# Patient Record
Sex: Female | Born: 1965 | Race: White | Hispanic: No | Marital: Married | Smoking: Never smoker
Health system: Southern US, Community
[De-identification: ages and names within clinical notes are randomized; demographics above are authoritative.]

## PROBLEM LIST (undated history)

## (undated) DIAGNOSIS — J45909 Unspecified asthma, uncomplicated: Secondary | ICD-10-CM

## (undated) DIAGNOSIS — F419 Anxiety disorder, unspecified: Secondary | ICD-10-CM

## (undated) DIAGNOSIS — R112 Nausea with vomiting, unspecified: Secondary | ICD-10-CM

## (undated) DIAGNOSIS — Z973 Presence of spectacles and contact lenses: Secondary | ICD-10-CM

## (undated) DIAGNOSIS — J189 Pneumonia, unspecified organism: Secondary | ICD-10-CM

## (undated) DIAGNOSIS — T7840XA Allergy, unspecified, initial encounter: Secondary | ICD-10-CM

## (undated) DIAGNOSIS — T8859XA Other complications of anesthesia, initial encounter: Secondary | ICD-10-CM

## (undated) DIAGNOSIS — M5090 Cervical disc disorder, unspecified, unspecified cervical region: Secondary | ICD-10-CM

## (undated) DIAGNOSIS — R569 Unspecified convulsions: Secondary | ICD-10-CM

## (undated) DIAGNOSIS — Z9889 Other specified postprocedural states: Secondary | ICD-10-CM

## (undated) DIAGNOSIS — E669 Obesity, unspecified: Secondary | ICD-10-CM

## (undated) DIAGNOSIS — T4145XA Adverse effect of unspecified anesthetic, initial encounter: Secondary | ICD-10-CM

## (undated) DIAGNOSIS — R609 Edema, unspecified: Secondary | ICD-10-CM

## (undated) DIAGNOSIS — F3281 Premenstrual dysphoric disorder: Secondary | ICD-10-CM

## (undated) DIAGNOSIS — M1990 Unspecified osteoarthritis, unspecified site: Secondary | ICD-10-CM

## (undated) HISTORY — PX: CHOLECYSTECTOMY: SHX55

## (undated) HISTORY — DX: Anxiety disorder, unspecified: F41.9

## (undated) HISTORY — DX: Obesity, unspecified: E66.9

## (undated) HISTORY — DX: Presence of spectacles and contact lenses: Z97.3

## (undated) HISTORY — PX: DIAGNOSTIC LAPAROSCOPY: SUR761

## (undated) HISTORY — PX: LACERATION REPAIR: SHX5168

## (undated) HISTORY — DX: Pneumonia, unspecified organism: J18.9

## (undated) HISTORY — PX: FOREARM FRACTURE SURGERY: SHX649

## (undated) HISTORY — DX: Unspecified asthma, uncomplicated: J45.909

## (undated) HISTORY — DX: Edema, unspecified: R60.9

## (undated) HISTORY — PX: FRACTURE SURGERY: SHX138

## (undated) HISTORY — DX: Cervical disc disorder, unspecified, unspecified cervical region: M50.90

## (undated) HISTORY — DX: Allergy, unspecified, initial encounter: T78.40XA

## (undated) HISTORY — PX: WISDOM TOOTH EXTRACTION: SHX21

## (undated) HISTORY — DX: Premenstrual dysphoric disorder: F32.81

## (undated) HISTORY — PX: ROTATOR CUFF REPAIR: SHX139

---

## 1999-03-03 HISTORY — PX: TUBAL LIGATION: SHX77

## 2003-03-03 DIAGNOSIS — J189 Pneumonia, unspecified organism: Secondary | ICD-10-CM

## 2003-03-03 HISTORY — DX: Pneumonia, unspecified organism: J18.9

## 2007-11-11 ENCOUNTER — Ambulatory Visit: Payer: Self-pay | Admitting: Family Medicine

## 2008-09-25 ENCOUNTER — Ambulatory Visit: Payer: Self-pay | Admitting: Family Medicine

## 2008-10-05 ENCOUNTER — Encounter: Admission: RE | Admit: 2008-10-05 | Discharge: 2008-10-05 | Payer: Self-pay | Admitting: Family Medicine

## 2008-11-20 ENCOUNTER — Other Ambulatory Visit: Admission: RE | Admit: 2008-11-20 | Discharge: 2008-11-20 | Payer: Self-pay | Admitting: Family Medicine

## 2008-11-20 ENCOUNTER — Ambulatory Visit: Payer: Self-pay | Admitting: Family Medicine

## 2008-11-20 LAB — HM PAP SMEAR: HM Pap smear: NEGATIVE

## 2009-03-02 DIAGNOSIS — R569 Unspecified convulsions: Secondary | ICD-10-CM

## 2009-03-02 HISTORY — DX: Unspecified convulsions: R56.9

## 2010-01-08 ENCOUNTER — Ambulatory Visit: Payer: Self-pay | Admitting: Family Medicine

## 2010-02-05 ENCOUNTER — Ambulatory Visit: Payer: Self-pay | Admitting: Family Medicine

## 2010-02-06 ENCOUNTER — Encounter
Admission: RE | Admit: 2010-02-06 | Discharge: 2010-02-06 | Payer: Self-pay | Source: Home / Self Care | Admitting: Family Medicine

## 2010-09-04 ENCOUNTER — Ambulatory Visit (INDEPENDENT_AMBULATORY_CARE_PROVIDER_SITE_OTHER): Payer: BC Managed Care – PPO | Admitting: Medical

## 2010-09-04 ENCOUNTER — Encounter: Payer: Self-pay | Admitting: Medical

## 2010-09-04 VITALS — BP 110/78 | HR 88 | Temp 98.1°F | Ht 70.0 in | Wt 287.0 lb

## 2010-09-04 DIAGNOSIS — F419 Anxiety disorder, unspecified: Secondary | ICD-10-CM

## 2010-09-04 DIAGNOSIS — F410 Panic disorder [episodic paroxysmal anxiety] without agoraphobia: Secondary | ICD-10-CM

## 2010-09-04 DIAGNOSIS — F411 Generalized anxiety disorder: Secondary | ICD-10-CM

## 2010-09-04 MED ORDER — SERTRALINE HCL 25 MG PO TABS: 25.0000 mg | ORAL_TABLET | Freq: Every day | ORAL | Status: DC

## 2010-09-04 MED ORDER — ALPRAZOLAM 0.5 MG PO TABS: 0.5000 mg | ORAL_TABLET | Freq: Three times a day (TID) | ORAL | Status: DC | PRN

## 2010-09-04 NOTE — Progress Notes (Signed)
Subjective:     Adriana Rice is a 45 y.o. female who presents for new evaluation and treatment of anxiety disorder and panic attacks. She has the following anxiety symptoms: irritable, panic attacks and decreased desire to do things she normally likes doing.. Onset of symptoms was approximately 1 year ago shortly after shoulder surgery, but things have been worse since January when a new top guy came into control as her place of employment.  Symptoms have been gradually worsening since that time.  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  Things have overall been stressful since he came aboard.  She has approached him about some of these issues, but things haven't changed.  She also notes that crowds make her panicky, certain things at work can set off a panic attack.  She notes that her home life is great, she loves her family, husband is supportive, and no problems at home. She is exercising regularly.  She sees a trainer once weekly, does spin class 2 x/wk, and also does treadmill and Zumba other days per week.      She denies current suicidal and homicidal ideation.  Previous treatment includes Lexapro and Xanax in remote past, and was on some medication for 10 days last year.  Neither Lexapro or the other medication helped in the past, but she was on never for greater than a month.  She notes that current measure to help include deep breathing, taking walks, avoiding stressors, and listening to music.  She is just not dealing with things as well, and thinks she should probably be on medication as well.  She has never seen a Veterinary surgeon.  She does have good support through friends and her husband.    The following portions of the patient's history were reviewed and updated as appropriate: allergies, current medications, past family history, past medical  history, past social history, past surgical history and problem list.  Past Medical History  Diagnosis Date  . PMDD (premenstrual dysphoric disorder)   . Obesity     Review of Systems   General: No recent weight change, no anorexia Neuro: No sleep changes Psychology: Denies suicidal ideation, hallucinations, alcohol use, drug use   Objective:     Filed Vitals:   09/04/10 1436  BP: 110/78  Pulse: 88  Temp: 98.1 F (36.7 C)    General appearance: alert, no distress, WD/WN, female Psychiatric: normal affect, behavior normal, pleasant, normal hygiene and grooming.       Assessment:   Encounter Diagnoses  Name Primary?  Marland Kitchen Anxiety Yes  . Panic attack     Plan:    Reviewed concept of anxiety as biochemical imbalance of neurotransmitters and rationale for treatment. Instructed patient to contact office or on-call physician promptly should condition worsen or any new symptoms appear and provided on-call telephone numbers. IF THE PATIENT HAS ANY SUICIDAL OR HOMICIDAL IDEATIONS, CALL THE OFFICE, DISCUSS WITH A SUPPORT MEMBER, OR GO TO THE ER IMMEDIATELY. Patient was agreeable with this plan.   Discussed symptoms and concerns.  We will use a trial of Zoloft QHS, and Xanax prn use.  Discussed risks/benefits of medication, addictive potential of Xanax, and this is to be used sparingly.   Consider counseling.  Gave handout on counselors/contact info.  Discussed ways to deal with anxiety and stressors.  Recheck in  2-3 wks.  Return or call sooner if worse or no improvement.   Greater than 30 min spent face-to-face discussing concerns, evaluation, treatment plan, and follow up.

## 2010-09-19 ENCOUNTER — Encounter: Payer: Self-pay | Admitting: Family Medicine

## 2010-09-22 ENCOUNTER — Ambulatory Visit (INDEPENDENT_AMBULATORY_CARE_PROVIDER_SITE_OTHER): Payer: BC Managed Care – PPO | Admitting: Medical

## 2010-09-22 ENCOUNTER — Encounter: Payer: Self-pay | Admitting: Medical

## 2010-09-22 VITALS — BP 120/78 | HR 64 | Ht 70.0 in | Wt 287.0 lb

## 2010-09-22 DIAGNOSIS — F419 Anxiety disorder, unspecified: Secondary | ICD-10-CM

## 2010-09-22 DIAGNOSIS — F411 Generalized anxiety disorder: Secondary | ICD-10-CM

## 2010-09-22 DIAGNOSIS — R51 Headache: Secondary | ICD-10-CM

## 2010-09-22 MED ORDER — SERTRALINE HCL 50 MG PO TABS: 50.0000 mg | ORAL_TABLET | Freq: Every day | ORAL | Status: DC

## 2010-09-22 NOTE — Progress Notes (Signed)
Subjective:     Adriana Rice is a 45 y.o. female who presents for recheck.  I saw her a few weeks ago for increased stress and anxiety symptoms, mostly work related.  We started her on Zoloft 25mg  which is helping significantly.  She notes some headache, but she has also recently cut back on caffeine.  She does feel as though she is dealing with the anxiety episodes better.  Work is no better in general, but she is coping and doing the best she can.  She is not seeing counseling.  No other new c/o.    She has the following anxiety symptoms: irritable, panic attacks and decreased desire to do things she normally likes doing.. Onset of symptoms was approximately 1 year ago shortly after shoulder surgery, but things have been worse since January when a new top guy came into control as her place of employment.  Symptoms have been gradually worsening since that time.  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  Things have overall been stressful since he came aboard.  She has approached him about some of these issues, but things haven't changed.  She also notes that crowds make her panicky, certain things at work can set off a panic attack.  She notes that her home life is great, she loves her family, husband is supportive, and no problems at home. She is exercising regularly.  She sees a trainer once weekly, does spin class 2 x/wk, and also does treadmill and Zumba other days per week.      She denies current suicidal and homicidal ideation.  Previous treatment includes Lexapro and Xanax in remote past, and was on some medication for 10 days last year.  Neither Lexapro or the other medication helped in the past, but she was on never for greater than a month.  She notes that current measure to help include deep breathing, taking walks, avoiding stressors, and  listening to music.  She is just not dealing with things as well, and thinks she should probably be on medication as well.  She has never seen a Veterinary surgeon.  She does have good support through friends and her husband.    The following portions of the patient's history were reviewed and updated as appropriate: allergies, current medications, past family history, past medical history, past social history, past surgical history and problem list.  Past Medical History  Diagnosis Date  . Obesity   . PMDD (premenstrual dysphoric disorder)     Review of Systems   General: No recent weight change, no anorexia Allergy: no allergy problems, congestion ENT: no sore throat, ear pain, sinus pressure Heart: no cp, palpitations Lungs: no SOB, wheezing GI: no abdominal pain, nausea, vomiting Neuro: No sleep changes Psychology: Denies suicidal ideation, hallucinations, alcohol use, drug use   Objective:     Filed Vitals:   09/22/10 0808  BP: 120/78  Pulse: 64    General appearance: alert, no distress, WD/WN, female Psychiatric: normal affect, behavior normal, pleasant, normal hygiene and grooming.       Assessment:   Encounter Diagnoses  Name Primary?  Marland Kitchen Anxiety Yes  . Headache      Plan:  Anxiety - improved on Zoloft 25 mg. We will increase to 50 mg each bedtime. Discussed other strategies to reduce stress and deal with  anxiety. If the headaches continue or worsen, we will need to consider alternate medication. Of note, she has only had to use Xanax 2 times since the last visit.  Headache-likely either a caffeine withdrawal headache or medication related. Although headaches have been mild, if they persist we will need to consider changing Zoloft.

## 2010-11-04 ENCOUNTER — Other Ambulatory Visit: Payer: Self-pay | Admitting: Medical

## 2010-11-04 ENCOUNTER — Telehealth: Payer: Self-pay | Admitting: Medical

## 2010-11-04 MED ORDER — ALPRAZOLAM 0.5 MG PO TABS: 0.5000 mg | ORAL_TABLET | Freq: Three times a day (TID) | ORAL | Status: DC | PRN

## 2010-11-04 NOTE — Telephone Encounter (Signed)
pls phone in Xanax 0.5mg , 1/2-1 tablet QHS or up to TID prn, #20, no refill.  I already ordered in chart, but needs to be called out.

## 2010-11-05 ENCOUNTER — Telehealth: Payer: Self-pay | Admitting: Medical

## 2010-11-05 NOTE — Telephone Encounter (Signed)
DONE

## 2010-12-10 ENCOUNTER — Other Ambulatory Visit: Payer: Self-pay | Admitting: Medical

## 2010-12-10 ENCOUNTER — Telehealth: Payer: Self-pay | Admitting: Medical

## 2010-12-10 MED ORDER — SERTRALINE HCL 50 MG PO TABS: 50.0000 mg | ORAL_TABLET | Freq: Every day | ORAL | Status: DC

## 2010-12-10 NOTE — Telephone Encounter (Signed)
PT'S [facility] ID #[id number]

## 2011-02-02 ENCOUNTER — Telehealth: Payer: Self-pay | Admitting: Medical

## 2011-02-02 ENCOUNTER — Other Ambulatory Visit: Payer: Self-pay | Admitting: Medical

## 2011-02-02 MED ORDER — ALPRAZOLAM 0.5 MG PO TABS: 0.5000 mg | ORAL_TABLET | Freq: Three times a day (TID) | ORAL | Status: DC | PRN

## 2011-02-02 NOTE — Telephone Encounter (Signed)
See msg/refill 

## 2011-02-03 NOTE — Telephone Encounter (Signed)
I CALLED OUT XANAX .5MG  TO [facility] PER SHANE TYSINGER. CLS

## 2011-02-03 NOTE — Progress Notes (Signed)
DONR

## 2011-05-16 ENCOUNTER — Other Ambulatory Visit: Payer: Self-pay | Admitting: Medical

## 2011-05-18 NOTE — Telephone Encounter (Signed)
RX REFILL 

## 2011-06-10 ENCOUNTER — Telehealth: Payer: Self-pay | Admitting: Medical

## 2011-06-10 ENCOUNTER — Other Ambulatory Visit: Payer: Self-pay | Admitting: Medical

## 2011-06-10 MED ORDER — SERTRALINE HCL 50 MG PO TABS: 50.0000 mg | ORAL_TABLET | Freq: Every day | ORAL | Status: DC

## 2011-06-10 MED ORDER — ALPRAZOLAM 0.5 MG PO TABS: 0.5000 mg | ORAL_TABLET | Freq: Three times a day (TID) | ORAL | Status: AC | PRN

## 2011-06-10 NOTE — Telephone Encounter (Signed)
Lmom notifiying the patient that her medications was called out and that she needs a recheck and physical appointment as well. CLS

## 2011-06-10 NOTE — Telephone Encounter (Signed)
PT CALLED AND STATED THAT SHE NEEDS REFILLS. PT NEEDS REFILL ON ZOLOFT SENT TO [facility] AND REFILL ON XANAX SENT TO [facility] [address]

## 2011-06-10 NOTE — Telephone Encounter (Signed)
i refilled both.  Pls call in the Xanax to local pharmacy as requested.  But she is due for recheck at this point.  If she is over a year since last physical, recommend a recheck/physical visit. Otherwise, recheck within a month or so.

## 2011-08-16 ENCOUNTER — Other Ambulatory Visit: Payer: Self-pay | Admitting: Medical

## 2011-08-17 NOTE — Telephone Encounter (Signed)
Is this ok?

## 2011-08-18 NOTE — Telephone Encounter (Signed)
She needs OV

## 2011-08-18 NOTE — Telephone Encounter (Signed)
Lmom for the patient letting her know that she needs to schedule a follow up appointment for a medication check. CLS

## 2011-08-18 NOTE — Telephone Encounter (Signed)
Patient was notified. CLS 

## 2012-09-19 ENCOUNTER — Telehealth: Payer: Self-pay | Admitting: Family Medicine

## 2012-09-19 NOTE — Telephone Encounter (Signed)
Message copied by Janeice Robinson on Mon Sep 19, 2012  4:30 PM ------      Message from: Jac Canavan      Created: Fri Sep 16, 2012  1:41 PM       Recommend CPX.  I got her recent orthopedic notes. ------

## 2012-09-19 NOTE — Telephone Encounter (Signed)
Patient is aware of setting up an appointment and she will call back once she looks at her schedule. CLS

## 2013-07-27 ENCOUNTER — Ambulatory Visit (INDEPENDENT_AMBULATORY_CARE_PROVIDER_SITE_OTHER): Payer: BC Managed Care – PPO | Admitting: Medical

## 2013-07-27 ENCOUNTER — Encounter: Payer: Self-pay | Admitting: Medical

## 2013-07-27 VITALS — BP 134/88 | HR 82 | Temp 98.2°F | Resp 18 | Ht 68.5 in | Wt 302.0 lb

## 2013-07-27 DIAGNOSIS — Z0000 Encounter for general adult medical examination without abnormal findings: Secondary | ICD-10-CM

## 2013-07-27 DIAGNOSIS — F341 Dysthymic disorder: Secondary | ICD-10-CM

## 2013-07-27 DIAGNOSIS — R232 Flushing: Secondary | ICD-10-CM

## 2013-07-27 DIAGNOSIS — F418 Other specified anxiety disorders: Secondary | ICD-10-CM

## 2013-07-27 DIAGNOSIS — G4700 Insomnia, unspecified: Secondary | ICD-10-CM

## 2013-07-27 DIAGNOSIS — E669 Obesity, unspecified: Secondary | ICD-10-CM

## 2013-07-27 DIAGNOSIS — J45909 Unspecified asthma, uncomplicated: Secondary | ICD-10-CM

## 2013-07-27 DIAGNOSIS — N951 Menopausal and female climacteric states: Secondary | ICD-10-CM

## 2013-07-27 LAB — HEMOGLOBIN A1C
Hgb A1c MFr Bld: 5.9 % — ABNORMAL HIGH (ref <5.7)
MEAN PLASMA GLUCOSE: 123 mg/dL — AB (ref <117)

## 2013-07-27 LAB — COMPREHENSIVE METABOLIC PANEL
ALT: 23 U/L (ref 0–35)
AST: 17 U/L (ref 0–37)
Albumin: 4 g/dL (ref 3.5–5.2)
Alkaline Phosphatase: 76 U/L (ref 39–117)
BILIRUBIN TOTAL: 0.5 mg/dL (ref 0.2–1.2)
BUN: 13 mg/dL (ref 6–23)
CO2: 23 mEq/L (ref 19–32)
CREATININE: 0.73 mg/dL (ref 0.50–1.10)
Calcium: 8.8 mg/dL (ref 8.4–10.5)
Chloride: 102 mEq/L (ref 96–112)
Glucose, Bld: 103 mg/dL — ABNORMAL HIGH (ref 70–99)
Potassium: 4.6 mEq/L (ref 3.5–5.3)
Sodium: 136 mEq/L (ref 135–145)
Total Protein: 6.7 g/dL (ref 6.0–8.3)

## 2013-07-27 LAB — CBC WITH DIFFERENTIAL/PLATELET
Basophils Absolute: 0 10*3/uL (ref 0.0–0.1)
Basophils Relative: 0 % (ref 0–1)
EOS ABS: 0.2 10*3/uL (ref 0.0–0.7)
Eosinophils Relative: 2 % (ref 0–5)
HCT: 38.8 % (ref 36.0–46.0)
Hemoglobin: 13.3 g/dL (ref 12.0–15.0)
LYMPHS ABS: 2.6 10*3/uL (ref 0.7–4.0)
LYMPHS PCT: 29 % (ref 12–46)
MCH: 29.2 pg (ref 26.0–34.0)
MCHC: 34.3 g/dL (ref 30.0–36.0)
MCV: 85.1 fL (ref 78.0–100.0)
Monocytes Absolute: 0.5 10*3/uL (ref 0.1–1.0)
Monocytes Relative: 6 % (ref 3–12)
NEUTROS PCT: 63 % (ref 43–77)
Neutro Abs: 5.7 10*3/uL (ref 1.7–7.7)
PLATELETS: 343 10*3/uL (ref 150–400)
RBC: 4.56 MIL/uL (ref 3.87–5.11)
RDW: 13.2 % (ref 11.5–15.5)
WBC: 9.1 10*3/uL (ref 4.0–10.5)

## 2013-07-27 LAB — LIPID PANEL
CHOL/HDL RATIO: 3.2 ratio
Cholesterol: 165 mg/dL (ref 0–200)
HDL: 52 mg/dL (ref >39)
LDL Cholesterol: 89 mg/dL (ref 0–99)
Triglycerides: 120 mg/dL (ref <150)
VLDL: 24 mg/dL (ref 0–40)

## 2013-07-27 MED ORDER — VORTIOXETINE HBR 20 MG PO TABS: 20.0000 mg | ORAL_TABLET | Freq: Every day | ORAL | Status: DC

## 2013-07-27 MED ORDER — ALBUTEROL SULFATE HFA 108 (90 BASE) MCG/ACT IN AERS: 2.0000 | INHALATION_SPRAY | Freq: Four times a day (QID) | RESPIRATORY_TRACT | Status: DC | PRN

## 2013-07-27 MED ORDER — ALPRAZOLAM 0.5 MG PO TABS: 0.5000 mg | ORAL_TABLET | Freq: Every evening | ORAL | Status: DC | PRN

## 2013-07-27 NOTE — Patient Instructions (Signed)
Thank you for giving me the opportunity to serve you today.    Your diagnosis today includes: Encounter Diagnoses  Name Primary?  . Routine general medical examination at a health care facility Yes  . Hot flashes   . Insomnia   . Obesity, unspecified   . Depression with anxiety   . Unspecified asthma(493.90)   . Need for prophylactic vaccination against Streptococcus pneumoniae (pneumococcus)      Specific recommendations today include:  Begin Brintellix 10mg  daily.  After the samples, we will increase to 20mg  daily  Use Xanax as needed for acute panic episode  Consider counsleing with private counselor  Return pending labs, 3-4 wk.    I have included other useful information below for your review.   Counseling services   [facility] [phone] office [email or web address] [address], [address], [place], [place] [address]  Gale Journey, therapist  Franchot Erichsen, MA, clinical psychologist  Cognitive-Behavior Therapy; Mood Disorders; Anxiety Disorders; adult and child ADHD; Family Therapy; Stress Management; personal growth, and Marital Therapy.    Carlus Pavlov Ph.D., clinical psychologist Cognitive-Behavior Therapy; Mood Disorders; Anxiety Disorders; Stress     Management   [facility] [address], [place], [place] [address] [phone]   [facility] Sheran Luz, psychotherapist [address] [place], [place] [address] [phone]   Miguel Aschoff Ph.D., clinical psychologist [phone] office [address] [place], [place] [address] Cognitive Behavior Therapy, Depression, Bipolar, Anxiety, Grief and Loss   Insomnia Insomnia is frequent trouble falling and/or staying asleep. Insomnia can be a long term problem or a short term problem. Both are common. Insomnia can be a short term problem when the wakefulness is related to a certain stress or worry. Long term  insomnia is often related to ongoing stress during waking hours and/or poor sleeping habits. Overtime, sleep deprivation itself can make the problem worse. Every little thing feels more severe because you are overtired and your ability to cope is decreased. CAUSES   Stress, anxiety, and depression.  Poor sleeping habits.  Distractions such as TV in the bedroom.  Naps close to bedtime.  Engaging in emotionally charged conversations before bed.  Technical reading before sleep.  Alcohol and other sedatives. They may make the problem worse. They can hurt normal sleep patterns and normal dream activity.  Stimulants such as caffeine for several hours prior to bedtime.  Pain syndromes and shortness of breath can cause insomnia.  Exercise late at night.  Changing time zones may cause sleeping problems (jet lag). It is sometimes helpful to have someone observe your sleeping patterns. They should look for periods of not breathing during the night (sleep apnea). They should also look to see how long those periods last. If you live alone or observers are uncertain, you can also be observed at a sleep clinic where your sleep patterns will be professionally monitored. Sleep apnea requires a checkup and treatment. Give your caregivers your medical history. Give your caregivers observations your family has made about your sleep.  SYMPTOMS   Not feeling rested in the morning.  Anxiety and restlessness at bedtime.  Difficulty falling and staying asleep. TREATMENT   Your caregiver may prescribe treatment for an underlying medical disorders. Your caregiver can give advice or help if you are using alcohol or other drugs for self-medication. Treatment of underlying problems will usually eliminate insomnia problems.  Medications can be prescribed for short time use. They are generally not recommended for lengthy use.  Over-the-counter  sleep medicines are not recommended for lengthy use. They can be  habit forming.  You can promote easier sleeping by making lifestyle changes such as:  Using relaxation techniques that help with breathing and reduce muscle tension.  Exercising earlier in the day.  Changing your diet and the time of your last meal. No night time snacks.  Establish a regular time to go to bed.  Counseling can help with stressful problems and worry.  Soothing music and white noise may be helpful if there are background noises you cannot remove.  Stop tedious detailed work at least one hour before bedtime. HOME CARE INSTRUCTIONS   Keep a diary. Inform your caregiver about your progress. This includes any medication side effects. See your caregiver regularly. Take note of:  Times when you are asleep.  Times when you are awake during the night.  The quality of your sleep.  How you feel the next day. This information will help your caregiver care for you.  Get out of bed if you are still awake after 15 minutes. Read or do some quiet activity. Keep the lights down. Wait until you feel sleepy and go back to bed.  Keep regular sleeping and waking hours. Avoid naps.  Exercise regularly.  Avoid distractions at bedtime. Distractions include watching television or engaging in any intense or detailed activity like attempting to balance the household checkbook.  Develop a bedtime ritual. Keep a familiar routine of bathing, brushing your teeth, climbing into bed at the same time each night, listening to soothing music. Routines increase the success of falling to sleep faster.  Use relaxation techniques. This can be using breathing and muscle tension release routines. It can also include visualizing peaceful scenes. You can also help control troubling or intruding thoughts by keeping your mind occupied with boring or repetitive thoughts like the old concept of counting sheep. You can make it more creative like imagining planting one beautiful flower after another in your  backyard garden.  During your day, work to eliminate stress. When this is not possible use some of the previous suggestions to help reduce the anxiety that accompanies stressful situations. MAKE SURE YOU:   Understand these instructions.  Will watch your condition.  Will get help right away if you are not doing well or get worse. Document Released: 02/14/2000 Document Revised: 05/11/2011 Document Reviewed: 03/16/2007 [facility] Patient Information 2014 [employer] [place].   Menopause Menopause is the normal time of life when menstrual periods stop completely. Menopause is complete when you have missed 12 consecutive menstrual periods. It usually occurs between the ages of 48 years and 55 years. Very rarely does a woman develop menopause before the age of 40 years. At menopause, your ovaries stop producing the female hormones estrogen and progesterone. This can cause undesirable symptoms and also affect your health. Sometimes the symptoms may occur 4 5 years before the menopause begins. There is no relationship between menopause and:  Oral contraceptives.  Number of children you had.  Race.  The age your menstrual periods started (menarche). Heavy smokers and very thin women may develop menopause earlier in life. CAUSES  The ovaries stop producing the female hormones estrogen and progesterone.  Other causes include:  Surgery to remove both ovaries.  The ovaries stop functioning for no known reason.  Tumors of the pituitary gland in the brain.  Medical disease that affects the ovaries and hormone production.  Radiation treatment to the abdomen or pelvis.  Chemotherapy that affects the ovaries. SYMPTOMS  Hot flashes.  Night sweats.  Decrease in sex drive.  Vaginal dryness and thinning of the vagina causing painful intercourse.  Dryness of the skin and developing wrinkles.  Headaches.  Tiredness.  Irritability.  Memory problems.  Weight gain.  Bladder  infections.  Hair growth of the face and chest.  Infertility. More serious symptoms include:  Loss of bone (osteoporosis) causing breaks (fractures).  Depression.  Hardening and narrowing of the arteries (atherosclerosis) causing heart attacks and strokes. DIAGNOSIS   When the menstrual periods have stopped for 12 straight months.  Physical exam.  Hormone studies of the blood. TREATMENT  There are many treatment choices and nearly as many questions about them. The decisions to treat or not to treat menopausal changes is an individual choice made with your health care provider. Your health care provider can discuss the treatments with you. Together, you can decide which treatment will work best for you. Your treatment choices may include:   Hormone therapy (estrogen and progesterone).  Non-hormonal medicines.  Treating the individual symptoms with medicine (for example antidepressants for depression).  Herbal medicines that may help specific symptoms.  Counseling by a psychiatrist or psychologist.  Group therapy.  Lifestyle changes including:  Eating healthy.  Regular exercise.  Limiting caffeine and alcohol.  Stress management and meditation.  No treatment. HOME CARE INSTRUCTIONS   Take the medicine your health care provider gives you as directed.  Get plenty of sleep and rest.  Exercise regularly.  Eat a diet that contains calcium (good for the bones) and soy products (acts like estrogen hormone).  Avoid alcoholic beverages.  Do not smoke.  If you have hot flashes, dress in layers.  Take supplements, calcium, and vitamin D to strengthen bones.  You can use over-the-counter lubricants or moisturizers for vaginal dryness.  Group therapy is sometimes very helpful.  Acupuncture may be helpful in some cases. SEEK MEDICAL CARE IF:   You are not sure you are in menopause.  You are having menopausal symptoms and need advice and treatment.  You are  still having menstrual periods after age 37 years.  You have pain with intercourse.  Menopause is complete (no menstrual period for 12 months) and you develop vaginal bleeding.  You need a referral to a specialist (gynecologist, psychiatrist, or psychologist) for treatment. SEEK IMMEDIATE MEDICAL CARE IF:   You have severe depression.  You have excessive vaginal bleeding.  You fell and think you have a broken bone.  You have pain when you urinate.  You develop leg or chest pain.  You have a fast pounding heart beat (palpitations).  You have severe headaches.  You develop vision problems.  You feel a lump in your breast.  You have abdominal pain or severe indigestion. Document Released: 05/09/2003 Document Revised: 10/19/2012 Document Reviewed: 09/15/2012 [facility] Patient Information 2014 [place], [place].
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
## 2013-07-28 LAB — VITAMIN D 25 HYDROXY (VIT D DEFICIENCY, FRACTURES): VIT D 25 HYDROXY: 41 ng/mL (ref 30–89)

## 2013-07-28 LAB — TSH: TSH: 3.138 u[IU]/mL (ref 0.350–4.500)

## 2013-08-25 ENCOUNTER — Encounter: Payer: Self-pay | Admitting: Medical

## 2013-08-25 ENCOUNTER — Ambulatory Visit (INDEPENDENT_AMBULATORY_CARE_PROVIDER_SITE_OTHER): Payer: BC Managed Care – PPO | Admitting: Medical

## 2013-08-25 VITALS — BP 152/80 | HR 72 | Temp 98.0°F | Resp 16 | Wt 303.0 lb

## 2013-08-25 DIAGNOSIS — M792 Neuralgia and neuritis, unspecified: Secondary | ICD-10-CM

## 2013-08-25 DIAGNOSIS — M542 Cervicalgia: Secondary | ICD-10-CM

## 2013-08-25 DIAGNOSIS — F341 Dysthymic disorder: Secondary | ICD-10-CM

## 2013-08-25 DIAGNOSIS — IMO0002 Reserved for concepts with insufficient information to code with codable children: Secondary | ICD-10-CM

## 2013-08-25 DIAGNOSIS — F418 Other specified anxiety disorders: Secondary | ICD-10-CM

## 2013-08-25 MED ORDER — PREGABALIN 75 MG PO CAPS: 75.0000 mg | ORAL_CAPSULE | Freq: Two times a day (BID) | ORAL | Status: DC

## 2013-08-25 MED ORDER — VORTIOXETINE HBR 20 MG PO TABS: 20.0000 mg | ORAL_TABLET | Freq: Every day | ORAL | Status: DC

## 2013-08-25 MED ORDER — ALPRAZOLAM 0.5 MG PO TABS: 0.5000 mg | ORAL_TABLET | Freq: Every evening | ORAL | Status: DC | PRN

## 2013-08-25 MED ORDER — ALPRAZOLAM 0.5 MG PO TABS: ORAL_TABLET | ORAL | Status: DC
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
## 2013-08-29 ENCOUNTER — Telehealth: Payer: Self-pay | Admitting: Family Medicine

## 2013-08-29 ENCOUNTER — Other Ambulatory Visit: Payer: Self-pay | Admitting: Medical

## 2013-08-29 MED ORDER — PREGABALIN 75 MG PO CAPS: 75.0000 mg | ORAL_CAPSULE | Freq: Two times a day (BID) | ORAL | Status: DC

## 2013-08-29 NOTE — Telephone Encounter (Signed)
PT called and states Lyrica is working well, please call in rx to [facility] [phone]

## 2013-08-29 NOTE — Telephone Encounter (Signed)
I called out her 90 day supply to [facility] per Crosby Oysteravid Tysinger PAC. CLS

## 2013-08-29 NOTE — Telephone Encounter (Signed)
pls fax this for 90 day supply

## 2013-10-02 ENCOUNTER — Other Ambulatory Visit: Payer: Self-pay | Admitting: Medical

## 2013-12-29 ENCOUNTER — Other Ambulatory Visit: Payer: Self-pay | Admitting: Medical

## 2013-12-29 ENCOUNTER — Telehealth: Payer: Self-pay | Admitting: Medical

## 2013-12-29 MED ORDER — PREGABALIN 75 MG PO CAPS: 75.0000 mg | ORAL_CAPSULE | Freq: Two times a day (BID) | ORAL | Status: DC

## 2013-12-29 MED ORDER — VORTIOXETINE HBR 20 MG PO TABS: 20.0000 mg | ORAL_TABLET | Freq: Every day | ORAL | Status: DC

## 2013-12-29 NOTE — Telephone Encounter (Signed)
[facility]

## 2013-12-29 NOTE — Telephone Encounter (Signed)
Pt needs Brintellix 20mg  & Lyrica 75mg  (said to tell you that it works very well) [facility]

## 2013-12-31 ENCOUNTER — Other Ambulatory Visit: Payer: Self-pay | Admitting: Medical

## 2014-05-03 ENCOUNTER — Other Ambulatory Visit: Payer: Self-pay | Admitting: Medical

## 2014-07-13 ENCOUNTER — Other Ambulatory Visit: Payer: Self-pay | Admitting: Medical

## 2014-07-26 ENCOUNTER — Encounter: Payer: Self-pay | Admitting: Medical

## 2014-07-26 ENCOUNTER — Ambulatory Visit (INDEPENDENT_AMBULATORY_CARE_PROVIDER_SITE_OTHER): Payer: BLUE CROSS/BLUE SHIELD | Admitting: Medical

## 2014-07-26 VITALS — BP 150/90 | HR 78 | Temp 97.9°F | Resp 16 | Wt 258.0 lb

## 2014-07-26 DIAGNOSIS — E669 Obesity, unspecified: Secondary | ICD-10-CM | POA: Diagnosis not present

## 2014-07-26 DIAGNOSIS — N943 Premenstrual tension syndrome: Secondary | ICD-10-CM | POA: Diagnosis not present

## 2014-07-26 DIAGNOSIS — J4520 Mild intermittent asthma, uncomplicated: Secondary | ICD-10-CM | POA: Insufficient documentation

## 2014-07-26 DIAGNOSIS — F3281 Premenstrual dysphoric disorder: Secondary | ICD-10-CM | POA: Insufficient documentation

## 2014-07-26 DIAGNOSIS — F329 Major depressive disorder, single episode, unspecified: Secondary | ICD-10-CM

## 2014-07-26 DIAGNOSIS — F32A Depression, unspecified: Secondary | ICD-10-CM

## 2014-07-26 DIAGNOSIS — Z7185 Encounter for immunization safety counseling: Secondary | ICD-10-CM

## 2014-07-26 DIAGNOSIS — R030 Elevated blood-pressure reading, without diagnosis of hypertension: Secondary | ICD-10-CM | POA: Diagnosis not present

## 2014-07-26 DIAGNOSIS — G4700 Insomnia, unspecified: Secondary | ICD-10-CM

## 2014-07-26 DIAGNOSIS — Z7189 Other specified counseling: Secondary | ICD-10-CM

## 2014-07-26 DIAGNOSIS — Z0000 Encounter for general adult medical examination without abnormal findings: Secondary | ICD-10-CM | POA: Diagnosis not present

## 2014-07-26 DIAGNOSIS — F411 Generalized anxiety disorder: Secondary | ICD-10-CM

## 2014-07-26 LAB — COMPREHENSIVE METABOLIC PANEL
ALBUMIN: 4.1 g/dL (ref 3.5–5.2)
ALT: 28 U/L (ref 0–35)
AST: 18 U/L (ref 0–37)
Alkaline Phosphatase: 77 U/L (ref 39–117)
BUN: 22 mg/dL (ref 6–23)
CHLORIDE: 105 meq/L (ref 96–112)
CO2: 25 meq/L (ref 19–32)
CREATININE: 0.77 mg/dL (ref 0.50–1.10)
Calcium: 9.4 mg/dL (ref 8.4–10.5)
Glucose, Bld: 92 mg/dL (ref 70–99)
Potassium: 4.8 mEq/L (ref 3.5–5.3)
SODIUM: 137 meq/L (ref 135–145)
Total Bilirubin: 0.5 mg/dL (ref 0.2–1.2)
Total Protein: 7.1 g/dL (ref 6.0–8.3)

## 2014-07-26 LAB — POCT URINALYSIS DIPSTICK
BILIRUBIN UA: NEGATIVE
Blood, UA: NEGATIVE
GLUCOSE UA: NEGATIVE
KETONES UA: NEGATIVE
Leukocytes, UA: NEGATIVE
Nitrite, UA: NEGATIVE
PH UA: 8
SPEC GRAV UA: 1.015
Urobilinogen, UA: NEGATIVE

## 2014-07-26 LAB — CBC
HCT: 42.3 % (ref 36.0–46.0)
Hemoglobin: 14.1 g/dL (ref 12.0–15.0)
MCH: 29.2 pg (ref 26.0–34.0)
MCHC: 33.3 g/dL (ref 30.0–36.0)
MCV: 87.6 fL (ref 78.0–100.0)
MPV: 9.9 fL (ref 8.6–12.4)
PLATELETS: 344 10*3/uL (ref 150–400)
RBC: 4.83 MIL/uL (ref 3.87–5.11)
RDW: 13.4 % (ref 11.5–15.5)
WBC: 8 10*3/uL (ref 4.0–10.5)

## 2014-07-26 LAB — LIPID PANEL
Cholesterol: 159 mg/dL (ref 0–200)
HDL: 46 mg/dL (ref >=46)
LDL Cholesterol: 97 mg/dL (ref 0–99)
TRIGLYCERIDES: 79 mg/dL (ref <150)
Total CHOL/HDL Ratio: 3.5 Ratio
VLDL: 16 mg/dL (ref 0–40)

## 2014-07-26 MED ORDER — ALBUTEROL SULFATE HFA 108 (90 BASE) MCG/ACT IN AERS: INHALATION_SPRAY | RESPIRATORY_TRACT | Status: AC

## 2014-07-26 MED ORDER — ALPRAZOLAM 0.5 MG PO TABS: ORAL_TABLET | ORAL | Status: DC

## 2014-07-26 MED ORDER — PREGABALIN 75 MG PO CAPS: 75.0000 mg | ORAL_CAPSULE | Freq: Two times a day (BID) | ORAL | Status: DC

## 2014-07-26 MED ORDER — VORTIOXETINE HBR 20 MG PO TABS: 20.0000 mg | ORAL_TABLET | Freq: Every day | ORAL | Status: DC

## 2014-07-26 NOTE — Progress Notes (Signed)
Subjective:   HPI  Adriana Rice is a 49 y.o. female who presents for a complete physical.  Preventative care: Last ophthalmology visit:2015, Dr Clent RidgesFry Last dental visit:2014, [place] [facility] Last colonoscopy:no Last mammogram:2014 Last gynecological exam: looking for gyn Last EKG:yrs ago Last labs:fasting for labs today  Prior vaccinations: TD or Tdap:current Influenza:2015 yearly at work Pneumococcal: never, declines today Shingles/Zostavax:no  Concerns: Elevated BPs here - she notes that she checks BP at home and drug stores, and routinely BPs are 120-130s/80s.    Doing well on brintellix for depression and anxiety.  Neck cervical pain - still taking Lyrica, but seeing Dr. Venetia MaxonStern neurosurgery consult next month  She has a history of asthma, worse after pneumonia several years ago, occasional flare up with strenuous activity, hasn't had to use inhaler in quite a while  Reviewed their medical, surgical, family, social, medication, and allergy history and updated chart as appropriate.  Past Medical History  Diagnosis Date  . Obesity   . PMDD (premenstrual dysphoric disorder)   . Cervical disc disease   . Anxiety   . Asthma   . Edema   . Pneumonia 2005  . Allergy   . Wears glasses     Past Surgical History  Procedure Laterality Date  . Cholecystectomy    . Cesarean section      x3  . Rotator cuff repair      [facility], [facility]  . Laceration repair      chin  . Wisdom tooth extraction    . Forearm fracture surgery      right as a child, foosh injury    History   Social History  . Marital Status: Married    Spouse Name: N/A  . Number of Children: N/A  . Years of Education: N/A   Occupational History  . Not on file.   Social History Main Topics  . Smoking status: Never Smoker   . Smokeless tobacco: Never Used  . Alcohol Use: 1.0 oz/week    2 Standard drinks or equivalent per week     Comment: occasionally  . Drug Use: No  . 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 Not on file   Social History Narrative   Married, husband is legal blind, 2 teenage boys, 21yo son in college at [employer], exercise with walking 3 days per week, cardio, walks frequently    Family History  Problem Relation Age of Onset  . Diabetes Father   . Hyperlipidemia Father   . Hypertension Father   . Gout Father   . Depression Brother   . Hyperlipidemia Brother   . Hypertension Brother   . Gout Brother   . Stroke Maternal Grandfather   . Cancer Neg Hx   . Heart disease Neg Hx      Current outpatient prescriptions:  .  ALPRAZolam (XANAX) 0.5 MG tablet, QHS or up to BID prn, Disp: 100 tablet, Rfl: 0 .  BRINTELLIX 20 MG TABS, TAKE 1 TABLET DAILY, Disp: 90 tablet, Rfl: 0 .  cholecalciferol (VITAMIN D) 1000 UNITS tablet, Take 1,200 Units by mouth daily., Disp: , Rfl:  .  magnesium 30 MG tablet, Take 30 mg by mouth daily.  , Disp: , Rfl:  .  Melatonin 5 MG CAPS, Take by mouth., Disp: , Rfl:  .  naproxen sodium (ANAPROX) 220 MG tablet,  Take 220 mg by mouth 2 (two) times daily with a meal., Disp: , Rfl:  .  pregabalin (LYRICA) 75 MG capsule, Take 1 capsule (75 mg total) by mouth 2 (two) times daily., Disp: 180 capsule, Rfl: 0 .  PROAIR HFA 108 (90 BASE) MCG/ACT inhaler, USE 2 INHALATIONS INTO THE LUNGS EVERY 6 HOURS AS NEEDED FOR WHEEZING OR SHORTNESS OF BREATH, Disp: 3 each, Rfl: 1 .  calcium carbonate (OS-CAL) 600 MG TABS tablet, Take 600 mg by mouth 2 (two) times daily with a meal., Disp: , Rfl:  .  Multiple Vitamin (MULTIVITAMIN) tablet, Take 1 tablet by mouth daily.  , Disp: , Rfl:   No Known Allergies   Review of Systems Constitutional: -fever, -chills, -sweats, -unexpected weight change, -decreased appetite, -fatigue Allergy: -sneezing, -itching, -congestion Dermatology: -changing moles, --rash, -lumps ENT: -runny nose, -ear  pain, -sore throat, -hoarseness, -sinus pain, -teeth pain, - ringing in ears, -hearing loss, -nosebleeds Cardiology: -chest pain, -palpitations, -swelling, -difficulty breathing when lying flat, -waking up short of breath Respiratory: -cough, -shortness of breath, -difficulty breathing with exercise or exertion, -wheezing, -coughing up blood Gastroenterology: -abdominal pain, -nausea, -vomiting, -diarrhea, -constipation, -blood in stool, -changes in bowel movement, -difficulty swallowing or eating Hematology: -bleeding, -bruising  Musculoskeletal: -joint aches, -muscle aches, -joint swelling, -back pain, +neck pain, -cramping, -changes in gait Ophthalmology: denies vision changes, eye redness, itching, discharge Urology: -burning with urination, -difficulty urinating, -blood in urine, -urinary frequency, -urgency, -incontinence Neurology: -headache, +weakness, -tingling, -numbness, +memory loss, -falls, -dizziness Psychology: -depressed mood, -agitation, -sleep problems     Objective:   Physical Exam  BP 150/90 mmHg  Pulse 78  Temp(Src) 97.9 F (36.6 C) (Oral)  Resp 16  Wt 258 lb (117.028 kg)  LMP 07/04/2014 (Approximate)  General appearance: alert, no distress, WD/WN, obese white female Skin: scattered macules of torso and extremities, no specific worrisome lesions HEENT: normocephalic, conjunctiva/corneas normal, sclerae anicteric, PERRLA, EOMi, nares patent, no discharge or erythema, pharynx normal Oral cavity: MMM, tongue normal, teeth in good repair Neck: supple, no lymphadenopathy, no thyromegaly, no masses, normal ROM, no bruits Chest: non tender, normal shape and expansion Heart: RRR, normal S1, S2, no murmurs Lungs: CTA bilaterally, no wheezes, rhonchi, or rales Abdomen: +bs, soft, non tender, non distended, no masses, no hepatomegaly, no splenomegaly, no bruits Back: non tender, normal ROM, no scoliosis Musculoskeletal: upper extremities non tender, no obvious deformity,  normal ROM throughout, lower extremities non tender, no obvious deformity, normal ROM throughout Extremities: no edema, no cyanosis, no clubbing Pulses: 2+ symmetric, upper and lower extremities, normal cap refill Neurological: alert, oriented x 3, CN2-12 intact, strength normal upper extremities and lower extremities, sensation normal throughout, DTRs 2+ throughout, no cerebellar signs, gait normal Psychiatric: normal affect, behavior normal, pleasant  Breast/gyn/rectal - deferred to gynecology     Assessment and Plan :    Encounter Diagnoses  Name Primary?  . Routine general medical examination at a health care facility Yes  . Asthma, mild intermittent, uncomplicated   . Obesity   . Generalized anxiety disorder   . PMDD (premenstrual dysphoric disorder)   . Depression   . Insomnia   . Elevated blood-pressure reading without diagnosis of hypertension   . Vaccine counseling     Physical exam - discussed healthy lifestyle, diet, exercise, preventative care, vaccinations, and addressed their concerns.  See dentist, eye doctor and gynecology yearly Hot flashes - discussed her symptoms of perimenopause, ways to cope Insomnia - discussed strategies, sleep hygiene, OTC medications to  try Obesity - c/t to work on weight loss through diet and exercise Depression with anxiety - c/t Brintellix, Xanax prn.  disused medication options, risks/benefits of medication.   Asthma - refilled inhaler for prn use Follow-up pending labs

## 2014-07-27 LAB — HEMOGLOBIN A1C
Hgb A1c MFr Bld: 5.7 % — ABNORMAL HIGH (ref <5.7)
Mean Plasma Glucose: 117 mg/dL — ABNORMAL HIGH (ref <117)

## 2014-07-27 LAB — TSH: TSH: 2.262 u[IU]/mL (ref 0.350–4.500)

## 2014-08-23 ENCOUNTER — Other Ambulatory Visit: Payer: Self-pay | Admitting: Neurosurgery

## 2014-09-13 ENCOUNTER — Encounter (HOSPITAL_COMMUNITY)
Admission: RE | Admit: 2014-09-13 | Discharge: 2014-09-13 | Disposition: A | Discharge status: Home / Self Care | Payer: BLUE CROSS/BLUE SHIELD | Source: Ambulatory Visit | Attending: Neurosurgery | Admitting: Neurosurgery

## 2014-09-13 ENCOUNTER — Encounter (HOSPITAL_COMMUNITY): Payer: Self-pay

## 2014-09-13 DIAGNOSIS — Z01812 Encounter for preprocedural laboratory examination: Secondary | ICD-10-CM | POA: Diagnosis not present

## 2014-09-13 DIAGNOSIS — M5020 Other cervical disc displacement, unspecified cervical region: Secondary | ICD-10-CM | POA: Insufficient documentation

## 2014-09-13 DIAGNOSIS — M5412 Radiculopathy, cervical region: Secondary | ICD-10-CM | POA: Diagnosis not present

## 2014-09-13 DIAGNOSIS — Z01818 Encounter for other preprocedural examination: Secondary | ICD-10-CM | POA: Diagnosis present

## 2014-09-13 DIAGNOSIS — J45909 Unspecified asthma, uncomplicated: Secondary | ICD-10-CM | POA: Insufficient documentation

## 2014-09-13 HISTORY — DX: Nausea with vomiting, unspecified: R11.2

## 2014-09-13 HISTORY — DX: Other complications of anesthesia, initial encounter: T88.59XA

## 2014-09-13 HISTORY — DX: Adverse effect of unspecified anesthetic, initial encounter: T41.45XA

## 2014-09-13 HISTORY — DX: Unspecified osteoarthritis, unspecified site: M19.90

## 2014-09-13 HISTORY — DX: Other specified postprocedural states: Z98.890

## 2014-09-13 HISTORY — DX: Unspecified convulsions: R56.9

## 2014-09-13 LAB — BASIC METABOLIC PANEL
ANION GAP: 6 (ref 5–15)
BUN: 13 mg/dL (ref 6–20)
CALCIUM: 8.8 mg/dL — AB (ref 8.9–10.3)
CO2: 26 mmol/L (ref 22–32)
Chloride: 106 mmol/L (ref 101–111)
Creatinine, Ser: 0.68 mg/dL (ref 0.44–1.00)
GFR calc Af Amer: >60 mL/min (ref >60)
Glucose, Bld: 103 mg/dL — ABNORMAL HIGH (ref 65–99)
POTASSIUM: 4.6 mmol/L (ref 3.5–5.1)
Sodium: 138 mmol/L (ref 135–145)

## 2014-09-13 LAB — SURGICAL PCR SCREEN
MRSA, PCR: NEGATIVE
Staphylococcus aureus: NEGATIVE

## 2014-09-13 LAB — CBC
HEMATOCRIT: 42.1 % (ref 36.0–46.0)
Hemoglobin: 13.8 g/dL (ref 12.0–15.0)
MCH: 29.1 pg (ref 26.0–34.0)
MCHC: 32.8 g/dL (ref 30.0–36.0)
MCV: 88.6 fL (ref 78.0–100.0)
PLATELETS: 307 10*3/uL (ref 150–400)
RBC: 4.75 MIL/uL (ref 3.87–5.11)
RDW: 13.2 % (ref 11.5–15.5)
WBC: 8.3 10*3/uL (ref 4.0–10.5)

## 2014-09-13 LAB — HCG, SERUM, QUALITATIVE: Preg, Serum: NEGATIVE

## 2014-09-13 NOTE — Pre-Procedure Instructions (Signed)
    Adriana PeekKristine L Rice  09/13/2014      [id number], [place] - [address] [place] [address] Phone: [phone] Fax: [phone]  [facility] [place] - [address] [place] [id number] Phone: [phone] Fax: [phone]  [facility] [place] - [address] [place] [id number] Phone: [phone] Fax: [phone]    Your procedure is scheduled on 09/26/2012.  [facility] Ad[facility] at 8:00 AM.  Call this number if you have questions in the days prior to your surgery:  [phone]  Call this number if you have problems the morning of surgery:  [phone]   Remember:  Do not eat food or drink liquids after midnight.  Take these medicines the morning of surgery with A SIP OF WATER: Inhaler, Alprazolam (Xanax), Lyrica, Brintillix   STOP: All Vitamins, Supplements, Herbal Medications, Fish Oils, Nonsteroidal Anti-inflammatories (NSAIDs), Aspirins, and Goody's/BC Powders 7 days prior to surgery.    Do not wear jewelry, make-up or nail polish.  Do not wear lotions, powders, or perfumes.  You may wear deodorant.  Do not shave 48 hours prior to surgery.    Do not bring valuables to the hospital.  [facility] is not responsible for any belongings or valuables.  Contacts, dentures or bridgework may not be worn into surgery.  Leave your suitcase in the car.  After surgery it may be brought to your room.  For patients admitted to the hospital, discharge time will be determined by your treatment team.  Patients discharged the day of surgery will not be allowed to drive home.    Please read over the following fact sheets that you were given. Pain Booklet, Coughing and Deep Breathing, MRSA Information and Surgical Site Infection Prevention

## 2014-09-13 NOTE — Progress Notes (Signed)
Patient admits to being told she had a seizure during rotator cuff surgery in 2011 (Dr. Dion SaucierLandau).  Patient states she also has trouble ('more than normal') waking up from anesthesia, with severe postop nausea and vomiting.  This is documented in the chart.  Will send to Allison/Angela for anesthesia review.

## 2014-09-13 NOTE — Progress Notes (Signed)
Anesthesia Chart Review:  Pt is 49 year old female scheduled for C5-6 ADCF on 09/27/2014 with Dr. Venetia MaxonStern.   PMH includes: seizures (peri-operatively- see notes below), asthma. Never smoker. BMI 39.  Preoperative labs reviewed.    Anesthesia history includes post-op nausea and vomiting. Pt has used scopolamine patch in the past peri-operatively with great success, would like to use for this surgery. Discussed with Dr. Katrinka BlazingSmith. Pt instructed to bring patch DOS and discuss use with assigned anesthesiologist.   Spoke with pt by telephone regarding peri-operative seizure history. Does not have history of seizures in general. No surgeon or anesthesiologist has ever told pt she had seizures in the peri-op period. Pt had dental work done and was told by dentist that damage to her teeth is consistent with damage caused by seizures, and she must have had a seizure at some point during surgery. Pt attempted to get records from surgical center where had rotator cuff repair in 2011 and was told the records could not be located.    If no changes, I anticipate pt can proceed with surgery as scheduled.   Adriana Mastngela Christmas Faraci, FNP-BC [facility]An[facility]iology Phone: [phone] 09/13/2014 4:36 PM

## 2014-09-24 NOTE — Discharge Instructions (Signed)

## 2014-09-26 MED ORDER — CEFAZOLIN SODIUM-DEXTROSE 2-3 GM-% IV SOLR
2.0000 g | INTRAVENOUS | Status: AC
  Administered 2014-09-27: 2 g via INTRAVENOUS
  Filled 2014-09-26: qty 50

## 2014-09-27 ENCOUNTER — Inpatient Hospital Stay (HOSPITAL_COMMUNITY)
Admission: RE | Admit: 2014-09-27 | Discharge: 2014-09-28 | DRG: 473 | Disposition: A | Discharge status: Home / Self Care | Payer: BLUE CROSS/BLUE SHIELD | Source: Ambulatory Visit | Attending: Neurosurgery | Admitting: Neurosurgery

## 2014-09-27 ENCOUNTER — Ambulatory Visit (HOSPITAL_COMMUNITY): Payer: BLUE CROSS/BLUE SHIELD | Admitting: Anesthesiology

## 2014-09-27 ENCOUNTER — Encounter (HOSPITAL_COMMUNITY)
Admission: RE | Disposition: A | Discharge status: Home / Self Care | Payer: Self-pay | Source: Ambulatory Visit | Attending: Neurosurgery

## 2014-09-27 ENCOUNTER — Encounter (HOSPITAL_COMMUNITY): Payer: Self-pay | Admitting: *Deleted

## 2014-09-27 ENCOUNTER — Ambulatory Visit (HOSPITAL_COMMUNITY): Payer: BLUE CROSS/BLUE SHIELD

## 2014-09-27 ENCOUNTER — Ambulatory Visit (HOSPITAL_COMMUNITY): Payer: BLUE CROSS/BLUE SHIELD | Admitting: Emergency Medicine

## 2014-09-27 DIAGNOSIS — Z6839 Body mass index (BMI) 39.0-39.9, adult: Secondary | ICD-10-CM | POA: Diagnosis not present

## 2014-09-27 DIAGNOSIS — Z8249 Family history of ischemic heart disease and other diseases of the circulatory system: Secondary | ICD-10-CM | POA: Diagnosis not present

## 2014-09-27 DIAGNOSIS — M542 Cervicalgia: Secondary | ICD-10-CM | POA: Diagnosis present

## 2014-09-27 DIAGNOSIS — F329 Major depressive disorder, single episode, unspecified: Secondary | ICD-10-CM | POA: Diagnosis present

## 2014-09-27 DIAGNOSIS — Z833 Family history of diabetes mellitus: Secondary | ICD-10-CM | POA: Diagnosis not present

## 2014-09-27 DIAGNOSIS — J45909 Unspecified asthma, uncomplicated: Secondary | ICD-10-CM | POA: Diagnosis present

## 2014-09-27 DIAGNOSIS — M5012 Cervical disc disorder with radiculopathy, mid-cervical region: Secondary | ICD-10-CM | POA: Diagnosis present

## 2014-09-27 DIAGNOSIS — E6609 Other obesity due to excess calories: Secondary | ICD-10-CM | POA: Diagnosis present

## 2014-09-27 DIAGNOSIS — M5020 Other cervical disc displacement, unspecified cervical region: Secondary | ICD-10-CM | POA: Diagnosis present

## 2014-09-27 DIAGNOSIS — Z419 Encounter for procedure for purposes other than remedying health state, unspecified: Secondary | ICD-10-CM

## 2014-09-27 HISTORY — PX: ANTERIOR CERVICAL DECOMP/DISCECTOMY FUSION: SHX1161

## 2014-09-27 SURGERY — ANTERIOR CERVICAL DECOMPRESSION/DISCECTOMY FUSION 1 LEVEL
Anesthesia: General | Site: Spine Cervical

## 2014-09-27 MED ORDER — OXYCODONE-ACETAMINOPHEN 5-325 MG PO TABS
1.0000 | ORAL_TABLET | ORAL | Status: DC | PRN
  Administered 2014-09-27 – 2014-09-28 (×4): 2 via ORAL
  Filled 2014-09-27 (×4): qty 2

## 2014-09-27 MED ORDER — LACTATED RINGERS IV SOLN
INTRAVENOUS | Status: DC
  Administered 2014-09-27: 09:00:00 via INTRAVENOUS

## 2014-09-27 MED ORDER — GLYCOPYRROLATE 0.2 MG/ML IJ SOLN
INTRAMUSCULAR | Status: DC | PRN
  Administered 2014-09-27: 0.6 mg via INTRAVENOUS

## 2014-09-27 MED ORDER — DOCUSATE SODIUM 100 MG PO CAPS
100.0000 mg | ORAL_CAPSULE | Freq: Two times a day (BID) | ORAL | Status: DC
  Filled 2014-09-27: qty 1

## 2014-09-27 MED ORDER — PROPOFOL 10 MG/ML IV BOLUS
INTRAVENOUS | Status: AC
  Filled 2014-09-27: qty 20

## 2014-09-27 MED ORDER — VORTIOXETINE HBR 20 MG PO TABS: 20.0000 mg | ORAL_TABLET | Freq: Every day | ORAL | Status: DC

## 2014-09-27 MED ORDER — HYDROMORPHONE HCL 1 MG/ML IJ SOLN
0.2500 mg | INTRAMUSCULAR | Status: DC | PRN
  Administered 2014-09-27: 0.5 mg via INTRAVENOUS

## 2014-09-27 MED ORDER — ALUM & MAG HYDROXIDE-SIMETH 200-200-20 MG/5ML PO SUSP: 30.0000 mL | Freq: Four times a day (QID) | ORAL | Status: DC | PRN

## 2014-09-27 MED ORDER — MIDAZOLAM HCL 2 MG/2ML IJ SOLN
INTRAMUSCULAR | Status: AC
  Filled 2014-09-27: qty 2

## 2014-09-27 MED ORDER — HYDROMORPHONE HCL 1 MG/ML IJ SOLN
0.2500 mg | INTRAMUSCULAR | Status: DC | PRN
  Administered 2014-09-27 (×5): 0.5 mg via INTRAVENOUS

## 2014-09-27 MED ORDER — LIDOCAINE HCL 4 % MT SOLN
OROMUCOSAL | Status: DC | PRN
  Administered 2014-09-27: 4 mL via TOPICAL

## 2014-09-27 MED ORDER — MELATONIN 5 MG PO CAPS: 5.0000 mg | ORAL_CAPSULE | Freq: Every evening | ORAL | Status: DC | PRN

## 2014-09-27 MED ORDER — VITAMIN D3 25 MCG (1000 UNIT) PO TABS
1000.0000 [IU] | ORAL_TABLET | Freq: Every day | ORAL | Status: DC
  Filled 2014-09-27 (×2): qty 1

## 2014-09-27 MED ORDER — ACETAMINOPHEN 10 MG/ML IV SOLN
INTRAVENOUS | Status: AC
  Administered 2014-09-27: 1000 mg via INTRAVENOUS
  Filled 2014-09-27: qty 100

## 2014-09-27 MED ORDER — ONDANSETRON HCL 4 MG/2ML IJ SOLN
INTRAMUSCULAR | Status: DC | PRN
  Administered 2014-09-27: 4 mg via INTRAVENOUS

## 2014-09-27 MED ORDER — DEXAMETHASONE SODIUM PHOSPHATE 4 MG/ML IJ SOLN
INTRAMUSCULAR | Status: DC | PRN
  Administered 2014-09-27: 8 mg via INTRAVENOUS

## 2014-09-27 MED ORDER — PREGABALIN 75 MG PO CAPS
75.0000 mg | ORAL_CAPSULE | Freq: Two times a day (BID) | ORAL | Status: DC
  Administered 2014-09-27: 75 mg via ORAL
  Filled 2014-09-27: qty 1

## 2014-09-27 MED ORDER — MENTHOL 3 MG MT LOZG: 1.0000 | LOZENGE | OROMUCOSAL | Status: DC | PRN

## 2014-09-27 MED ORDER — POLYETHYLENE GLYCOL 3350 17 G PO PACK
17.0000 g | PACK | Freq: Every day | ORAL | Status: DC | PRN
  Filled 2014-09-27: qty 1

## 2014-09-27 MED ORDER — HYDROCODONE-ACETAMINOPHEN 5-325 MG PO TABS: 1.0000 | ORAL_TABLET | ORAL | Status: DC | PRN

## 2014-09-27 MED ORDER — METHOCARBAMOL 500 MG PO TABS
500.0000 mg | ORAL_TABLET | Freq: Four times a day (QID) | ORAL | Status: DC | PRN
  Administered 2014-09-27 – 2014-09-28 (×2): 500 mg via ORAL
  Filled 2014-09-27 (×3): qty 1

## 2014-09-27 MED ORDER — LACTATED RINGERS IV SOLN
INTRAVENOUS | Status: DC | PRN
  Administered 2014-09-27 (×2): via INTRAVENOUS

## 2014-09-27 MED ORDER — BUPIVACAINE HCL (PF) 0.5 % IJ SOLN
INTRAMUSCULAR | Status: DC | PRN
  Administered 2014-09-27: 4 mL

## 2014-09-27 MED ORDER — ALBUTEROL SULFATE HFA 108 (90 BASE) MCG/ACT IN AERS: 1.0000 | INHALATION_SPRAY | Freq: Four times a day (QID) | RESPIRATORY_TRACT | Status: DC | PRN

## 2014-09-27 MED ORDER — PHENOL 1.4 % MT LIQD: 1.0000 | OROMUCOSAL | Status: DC | PRN

## 2014-09-27 MED ORDER — ALPRAZOLAM 0.5 MG PO TABS: 0.5000 mg | ORAL_TABLET | ORAL | Status: DC | PRN

## 2014-09-27 MED ORDER — ONDANSETRON HCL 4 MG/2ML IJ SOLN: 4.0000 mg | INTRAMUSCULAR | Status: DC | PRN

## 2014-09-27 MED ORDER — HEMOSTATIC AGENTS (NO CHARGE) OPTIME
TOPICAL | Status: DC | PRN
  Administered 2014-09-27: 1 via TOPICAL

## 2014-09-27 MED ORDER — ACETAMINOPHEN 325 MG PO TABS: 650.0000 mg | ORAL_TABLET | ORAL | Status: DC | PRN

## 2014-09-27 MED ORDER — REMIFENTANIL HCL 1 MG IV SOLR
INTRAVENOUS | Status: DC | PRN
  Administered 2014-09-27: 21.8 ug via INTRAVENOUS

## 2014-09-27 MED ORDER — MEPERIDINE HCL 25 MG/ML IJ SOLN: 6.2500 mg | INTRAMUSCULAR | Status: DC | PRN

## 2014-09-27 MED ORDER — PANTOPRAZOLE SODIUM 40 MG IV SOLR
40.0000 mg | Freq: Every day | INTRAVENOUS | Status: DC
Start: 2014-09-27 — End: 2014-09-28
  Administered 2014-09-27: 40 mg via INTRAVENOUS
  Filled 2014-09-27 (×2): qty 40

## 2014-09-27 MED ORDER — MAGNESIUM 30 MG PO TABS: 30.0000 mg | ORAL_TABLET | Freq: Every day | ORAL | Status: DC

## 2014-09-27 MED ORDER — ARTIFICIAL TEARS OP OINT
TOPICAL_OINTMENT | OPHTHALMIC | Status: DC | PRN
  Administered 2014-09-27: 1 via OPHTHALMIC

## 2014-09-27 MED ORDER — REMIFENTANIL HCL 1 MG IV SOLR
INTRAVENOUS | Status: DC | PRN
  Administered 2014-09-27: .1 ug/kg/min via INTRAVENOUS

## 2014-09-27 MED ORDER — EPHEDRINE SULFATE 50 MG/ML IJ SOLN
INTRAMUSCULAR | Status: AC
  Filled 2014-09-27: qty 1

## 2014-09-27 MED ORDER — LIDOCAINE-EPINEPHRINE 1 %-1:100000 IJ SOLN
INTRAMUSCULAR | Status: DC | PRN
  Administered 2014-09-27: 4 mL

## 2014-09-27 MED ORDER — SODIUM CHLORIDE 0.9 % IJ SOLN: 3.0000 mL | INTRAMUSCULAR | Status: DC | PRN

## 2014-09-27 MED ORDER — PROMETHAZINE HCL 25 MG/ML IJ SOLN
INTRAMUSCULAR | Status: AC
Start: 2014-09-27 — End: 2014-09-28
  Filled 2014-09-27: qty 1

## 2014-09-27 MED ORDER — FENTANYL CITRATE (PF) 100 MCG/2ML IJ SOLN
INTRAMUSCULAR | Status: DC | PRN
  Administered 2014-09-27: 50 ug via INTRAVENOUS

## 2014-09-27 MED ORDER — 0.9 % SODIUM CHLORIDE (POUR BTL) OPTIME
TOPICAL | Status: DC | PRN
  Administered 2014-09-27: 1000 mL

## 2014-09-27 MED ORDER — FENTANYL CITRATE (PF) 250 MCG/5ML IJ SOLN
INTRAMUSCULAR | Status: AC
  Filled 2014-09-27: qty 5

## 2014-09-27 MED ORDER — HYDROMORPHONE HCL 1 MG/ML IJ SOLN
INTRAMUSCULAR | Status: AC
  Filled 2014-09-27: qty 2

## 2014-09-27 MED ORDER — SODIUM CHLORIDE 0.9 % IJ SOLN
INTRAMUSCULAR | Status: AC
  Filled 2014-09-27: qty 10

## 2014-09-27 MED ORDER — ACETAMINOPHEN 650 MG RE SUPP
650.0000 mg | RECTAL | Status: DC | PRN
  Filled 2014-09-27: qty 1

## 2014-09-27 MED ORDER — METOCLOPRAMIDE HCL 5 MG/ML IJ SOLN
5.0000 mg | INTRAMUSCULAR | Status: DC | PRN
  Administered 2014-09-27: 5 mg via INTRAVENOUS

## 2014-09-27 MED ORDER — SODIUM CHLORIDE 0.9 % IJ SOLN: 3.0000 mL | Freq: Two times a day (BID) | INTRAMUSCULAR | Status: DC

## 2014-09-27 MED ORDER — PROPOFOL 10 MG/ML IV BOLUS
INTRAVENOUS | Status: DC | PRN
  Administered 2014-09-27: 170 mg via INTRAVENOUS

## 2014-09-27 MED ORDER — HYDROMORPHONE HCL 1 MG/ML IJ SOLN
0.5000 mg | INTRAMUSCULAR | Status: DC | PRN
Start: 2014-09-27 — End: 2014-09-28

## 2014-09-27 MED ORDER — CEFAZOLIN SODIUM 1-5 GM-% IV SOLN
1.0000 g | Freq: Three times a day (TID) | INTRAVENOUS | Status: AC
  Administered 2014-09-27 – 2014-09-28 (×2): 1 g via INTRAVENOUS
  Filled 2014-09-27 (×2): qty 50

## 2014-09-27 MED ORDER — ROCURONIUM BROMIDE 50 MG/5ML IV SOLN
INTRAVENOUS | Status: AC
Start: 2014-09-27 — End: 2014-09-27
  Filled 2014-09-27: qty 1

## 2014-09-27 MED ORDER — NEOSTIGMINE METHYLSULFATE 10 MG/10ML IV SOLN
INTRAVENOUS | Status: DC | PRN
  Administered 2014-09-27: 4 mg via INTRAVENOUS

## 2014-09-27 MED ORDER — LIDOCAINE HCL (CARDIAC) 20 MG/ML IV SOLN
INTRAVENOUS | Status: DC | PRN
  Administered 2014-09-27: 50 mg via INTRAVENOUS

## 2014-09-27 MED ORDER — KCL IN DEXTROSE-NACL 20-5-0.45 MEQ/L-%-% IV SOLN
INTRAVENOUS | Status: DC
  Filled 2014-09-27 (×3): qty 1000

## 2014-09-27 MED ORDER — ROCURONIUM BROMIDE 100 MG/10ML IV SOLN
INTRAVENOUS | Status: DC | PRN
Start: 2014-09-27 — End: 2014-09-27
  Administered 2014-09-27: 40 mg via INTRAVENOUS

## 2014-09-27 MED ORDER — METOCLOPRAMIDE HCL 5 MG/ML IJ SOLN
INTRAMUSCULAR | Status: AC
  Filled 2014-09-27: qty 2

## 2014-09-27 MED ORDER — METHOCARBAMOL 1000 MG/10ML IJ SOLN
500.0000 mg | Freq: Four times a day (QID) | INTRAVENOUS | Status: DC | PRN
  Administered 2014-09-27: 500 mg via INTRAVENOUS
  Filled 2014-09-27 (×2): qty 5

## 2014-09-27 MED ORDER — ALBUTEROL SULFATE (2.5 MG/3ML) 0.083% IN NEBU: 2.5000 mg | INHALATION_SOLUTION | Freq: Four times a day (QID) | RESPIRATORY_TRACT | Status: DC | PRN

## 2014-09-27 MED ORDER — THROMBIN 5000 UNITS EX SOLR
CUTANEOUS | Status: DC | PRN
  Administered 2014-09-27 (×2): 5000 [IU] via TOPICAL

## 2014-09-27 MED ORDER — PROMETHAZINE HCL 25 MG/ML IJ SOLN
6.2500 mg | INTRAMUSCULAR | Status: DC | PRN
  Administered 2014-09-27: 6.25 mg via INTRAVENOUS

## 2014-09-27 MED ORDER — BISACODYL 10 MG RE SUPP: 10.0000 mg | Freq: Every day | RECTAL | Status: DC | PRN

## 2014-09-27 MED ORDER — MIDAZOLAM HCL 5 MG/5ML IJ SOLN
INTRAMUSCULAR | Status: DC | PRN
  Administered 2014-09-27: 2 mg via INTRAVENOUS

## 2014-09-27 SURGICAL SUPPLY — 69 items
BENZOIN TINCTURE PRP APPL 2/3 (GAUZE/BANDAGES/DRESSINGS) IMPLANT
BIT DRILL NEURO 2X3.1 SFT TUCH (MISCELLANEOUS) ×1 IMPLANT
BIT DRILL POWER (BIT) ×1 IMPLANT
BLADE ULTRA TIP 2M (BLADE) ×2 IMPLANT
BNDG GAUZE ELAST 4 BULKY (GAUZE/BANDAGES/DRESSINGS) ×4 IMPLANT
BUR BARREL STRAIGHT FLUTE 4.0 (BURR) ×2 IMPLANT
CAGE SMALL 7X13X15 (Cage) ×2 IMPLANT
CANISTER SUCT 3000ML PPV (MISCELLANEOUS) ×2 IMPLANT
CONT SPEC 4OZ CLIKSEAL STRL BL (MISCELLANEOUS) IMPLANT
COVER MAYO STAND STRL (DRAPES) ×2 IMPLANT
DECANTER SPIKE VIAL GLASS SM (MISCELLANEOUS) ×2 IMPLANT
DERMABOND ADVANCED (GAUZE/BANDAGES/DRESSINGS) ×1
DERMABOND ADVANCED .7 DNX12 (GAUZE/BANDAGES/DRESSINGS) ×1 IMPLANT
DRAPE LAPAROTOMY 100X72 PEDS (DRAPES) ×2 IMPLANT
DRAPE MICROSCOPE LEICA (MISCELLANEOUS) ×2 IMPLANT
DRAPE POUCH INSTRU U-SHP 10X18 (DRAPES) ×2 IMPLANT
DRAPE PROXIMA HALF (DRAPES) IMPLANT
DRILL BIT POWER (BIT) ×1
DRILL NEURO 2X3.1 SOFT TOUCH (MISCELLANEOUS) ×2
DRSG TELFA 3X8 NADH (GAUZE/BANDAGES/DRESSINGS) IMPLANT
DURAPREP 6ML APPLICATOR 50/CS (WOUND CARE) ×2 IMPLANT
ELECT COATED BLADE 2.86 ST (ELECTRODE) ×2 IMPLANT
ELECT REM PT RETURN 9FT ADLT (ELECTROSURGICAL) ×2
ELECTRODE REM PT RTRN 9FT ADLT (ELECTROSURGICAL) ×1 IMPLANT
GAUZE SPONGE 4X4 12PLY STRL (GAUZE/BANDAGES/DRESSINGS) IMPLANT
GAUZE SPONGE 4X4 16PLY XRAY LF (GAUZE/BANDAGES/DRESSINGS) IMPLANT
GLOVE BIO SURGEON STRL SZ 6.5 (GLOVE) ×4 IMPLANT
GLOVE BIO SURGEON STRL SZ8 (GLOVE) ×2 IMPLANT
GLOVE BIOGEL PI IND STRL 6.5 (GLOVE) ×1 IMPLANT
GLOVE BIOGEL PI IND STRL 8 (GLOVE) ×2 IMPLANT
GLOVE BIOGEL PI IND STRL 8.5 (GLOVE) ×1 IMPLANT
GLOVE BIOGEL PI INDICATOR 6.5 (GLOVE) ×1
GLOVE BIOGEL PI INDICATOR 8 (GLOVE) ×2
GLOVE BIOGEL PI INDICATOR 8.5 (GLOVE) ×1
GLOVE ECLIPSE 7.5 STRL STRAW (GLOVE) ×2 IMPLANT
GLOVE ECLIPSE 8.0 STRL XLNG CF (GLOVE) ×4 IMPLANT
GLOVE EXAM NITRILE LRG STRL (GLOVE) IMPLANT
GLOVE EXAM NITRILE MD LF STRL (GLOVE) IMPLANT
GLOVE EXAM NITRILE XL STR (GLOVE) IMPLANT
GLOVE EXAM NITRILE XS STR PU (GLOVE) IMPLANT
GOWN STRL REUS W/ TWL LRG LVL3 (GOWN DISPOSABLE) ×2 IMPLANT
GOWN STRL REUS W/ TWL XL LVL3 (GOWN DISPOSABLE) ×2 IMPLANT
GOWN STRL REUS W/TWL 2XL LVL3 (GOWN DISPOSABLE) ×2 IMPLANT
GOWN STRL REUS W/TWL LRG LVL3 (GOWN DISPOSABLE) ×2
GOWN STRL REUS W/TWL XL LVL3 (GOWN DISPOSABLE) ×2
HALTER HD/CHIN CERV TRACTION D (MISCELLANEOUS) ×2 IMPLANT
KIT BASIN OR (CUSTOM PROCEDURE TRAY) ×2 IMPLANT
KIT ROOM TURNOVER OR (KITS) ×2 IMPLANT
NEEDLE HYPO 18GX1.5 BLUNT FILL (NEEDLE) IMPLANT
NEEDLE HYPO 25X1 1.5 SAFETY (NEEDLE) ×2 IMPLANT
NEEDLE SPNL 22GX3.5 QUINCKE BK (NEEDLE) ×2 IMPLANT
NS IRRIG 1000ML POUR BTL (IV SOLUTION) ×2 IMPLANT
PACK LAMINECTOMY NEURO (CUSTOM PROCEDURE TRAY) ×2 IMPLANT
PAD ARMBOARD 7.5X6 YLW CONV (MISCELLANEOUS) ×6 IMPLANT
PIN DISTRACTION 14MM (PIN) ×4 IMPLANT
PLATE ARCHON 1-LEVEL 22MM (Plate) ×2 IMPLANT
RUBBERBAND STERILE (MISCELLANEOUS) ×4 IMPLANT
SCREW ARCHON SELFTAP 4.0X13 (Screw) ×8 IMPLANT
SPONGE INTESTINAL PEANUT (DISPOSABLE) ×2 IMPLANT
SPONGE SURGIFOAM ABS GEL SZ50 (HEMOSTASIS) ×2 IMPLANT
STAPLER SKIN PROX WIDE 3.9 (STAPLE) ×2 IMPLANT
STRIP CLOSURE SKIN 1/2X4 (GAUZE/BANDAGES/DRESSINGS) IMPLANT
SUT VIC AB 3-0 SH 8-18 (SUTURE) ×4 IMPLANT
SYR 20ML ECCENTRIC (SYRINGE) ×2 IMPLANT
SYR 3ML LL SCALE MARK (SYRINGE) IMPLANT
TOWEL OR 17X24 6PK STRL BLUE (TOWEL DISPOSABLE) ×2 IMPLANT
TOWEL OR 17X26 10 PK STRL BLUE (TOWEL DISPOSABLE) ×2 IMPLANT
TRAP SPECIMEN MUCOUS 40CC (MISCELLANEOUS) ×2 IMPLANT
WATER STERILE IRR 1000ML POUR (IV SOLUTION) ×2 IMPLANT

## 2014-09-27 NOTE — Progress Notes (Signed)
Pt c/o 6/10 pain after  IV Dilaudid and IV Robaxin. Dr Renold Don updated-new order for additional IV Dilaudid. Will cont to monitor closely.

## 2014-09-27 NOTE — Transfer of Care (Signed)
Immediate Anesthesia Transfer of Care Note  Patient: Adriana Rice  Procedure(s) Performed: Procedure(s) with comments: Cervical Five-Six  Anterior cervical decompression/diskectomy/fusion (N/A) - anterior   Patient Location: PACU  Anesthesia Type:General  Level of Consciousness: awake and alert   Airway & Oxygen Therapy: Patient Spontanous Breathing and Patient connected to nasal cannula oxygen  Post-op Assessment: Report given to RN, Post -op Vital signs reviewed and stable and Patient moving all extremities  Post vital signs: Reviewed and stable  Last Vitals:  Filed Vitals:   09/27/14 1416  BP:   Pulse:   Temp: 37 C  Resp:     Complications: No apparent anesthesia complications

## 2014-09-27 NOTE — Plan of Care (Signed)
Problem: Consults Goal: Diagnosis - Spinal Surgery Outcome: Completed/Met Date Met:  09/27/14 Cervical Spine Fusion

## 2014-09-27 NOTE — H&P (Signed)
Patient ID:   [id number] Patient: Adriana Rice  Date of Birth: 1965/10/23 Visit Type: Office Visit   Date: 08/06/2014 11:30 AM Provider: Danae Orleans. Venetia Maxon MD   This 49 year old female presents for neck pain and Arm pain.  History of Present Illness: 1.  neck pain  2.  Arm pain  Adriana Rice, 49 year old female employed as a VP for internal audits at [employer], visits reporting neck and right upper extremity pain since 2014.  Nerve conduction testing resulted is negative.  Wrist splint nightly.  Physical therapy, home traction, massage therapy have offered no lasting relief Lyrica 75 mg twice a day helps little  History: Asthma, depression Surgical history: C-sections' 94, '99,'01; cholecystectomy' 95; left rotator cuff repair' 11  MRI 2014 X-rays on [place]  The patient describes neck and right arm pain ranging from 3-7 out of 10.  I reviewed her previous MRI of the cervical spine which shows severe right foraminal stenosis at C5-C6 with right C6 nerve root compression.        PAST MEDICAL/SURGICAL HISTORY   (Detailed)  Disease/disorder Onset Date Management Date Comments    C-section  ER 08/06/2014 -1994, 1999, and 2001    Cholecystectomy 1995     Rotator cuff repair 2011   Anxiety      Arthritis      Asthma      Depression         PAST MEDICAL HISTORY, SURGICAL HISTORY, FAMILY HISTORY, SOCIAL HISTORY AND REVIEW OF SYSTEMS I have reviewed the patient's past medical, surgical, family and social history as well as the comprehensive review of systems as included on the [facility] history form dated 08/06/2014, which I have signed.  Family History  (Detailed) Relationship Family Member Name Deceased Age at Death Condition Onset Age Cause of Death  Father    Diabetes mellitus type 2  N  Father    Hypertension  N    SOCIAL HISTORY  (Detailed) Tobacco use reviewed. Preferred language is Unknown.   Smoking status: Never  smoker.  SMOKING STATUS Use Status Type Smoking Status Usage Per Day Years Used Total Pack Years  no/never  Never smoker             MEDICATIONS(added, continued or stopped this visit): Started Medication Directions Instruction Stopped   Brintellix 20 mg tablet take 1 tablet by oral route  every day     Lyrica 75 mg capsule take 1 capsule by oral route 2 times every day     Xanax 0.5 mg tablet As needed       ALLERGIES: Ingredient Reaction Medication Name Comment  NO KNOWN ALLERGIES     No known allergies.   REVIEW OF SYSTEMS System Neg/Pos Details  Constitutional Negative Chills, fatigue, fever, malaise, night sweats, weight gain and weight loss.  ENMT Negative Ear drainage, hearing loss, nasal drainage, otalgia, sinus pressure and sore throat.  Eyes Negative Eye discharge, eye pain and vision changes.  Respiratory Negative Chronic cough, cough, dyspnea, known TB exposure and wheezing.  Cardio Negative Chest pain, claudication, edema and irregular heartbeat/palpitations.  GI Negative Abdominal pain, blood in stool, change in stool pattern, constipation, decreased appetite, diarrhea, heartburn, nausea and vomiting.  GU Negative Dysuria, hematuria, hot flashes, irregular menses, polyuria, urinary frequency, urinary incontinence and urinary retention.  Endocrine Negative Cold intolerance, heat intolerance, polydipsia and polyphagia.  Neuro Negative Dizziness, extremity weakness, gait disturbance, headache, memory impairment, numbness in extremity, seizures and tremors.  Psych Negative  Anxiety, depression and insomnia.  Integumentary Negative Brittle hair, brittle nails, change in shape/size of mole(s), hair loss, hirsutism, hives, pruritus, rash and skin lesion.  MS Positive Neck pain, RUE pain.  Hema/Lymph Negative Easy bleeding, easy bruising and lymphadenopathy.  Allergic/Immuno Negative Contact allergy, environmental allergies, food allergies and seasonal allergies.   Reproductive Negative Breast discharge, breast lump(s), dysmenorrhea, dyspareunia, history of abnormal PAP smear and vaginal discharge.     Vitals Date Temp F BP Pulse Ht In Wt Lb BMI BSA Pain Score  08/06/2014  152/88 81 68 259 39.38  2/10     PHYSICAL EXAM General Level of Distress: no acute distress Overall Appearance: normal    Cardiovascular Cardiac: regular rate and rhythm without murmur  Right Left  Carotid Pulses: normal normal  Respiratory Lungs: clear to auscultation  Neurological Orientation: normal Recent and Remote Memory: normal Attention Span and Concentration:   normal Language: normal Fund of Knowledge: normal  Right Left Sensation: normal normal Upper Extremity Coordination: normal normal  Lower Extremity Coordination: normal normal  Musculoskeletal Gait and Station: normal  Right Left Upper Extremity Muscle Strength: normal normal Lower Extremity Muscle Strength: normal normal Upper Extremity Muscle Tone:  normal normal Lower Extremity Muscle Tone: normal normal  Motor Strength Upper and lower extremity motor strength was tested in the clinically pertinent muscles. Any abnormal findings will be noted below.   Right Left Biceps: 4/5    Deep Tendon Reflexes  Right Left Biceps: absent normal Triceps: normal normal Brachiloradialis: normal normal Patellar: normal normal Achilles: normal normal  Sensory Sensation was tested at C2 to T1. Any abnormal findings will be noted below.  Right Left C6: decreased   Cranial Nerves II. Optic Nerve/Visual Fields: normal III. Oculomotor: normal IV. Trochlear: normal V. Trigeminal: normal VI. Abducens: normal VII. Facial: normal VIII. Acoustic/Vestibular: normal IX. Glossopharyngeal: normal X. Vagus: normal XI. Spinal Accessory: normal XII. Hypoglossal: normal  Motor and other Tests Lhermittes: negative Rhomberg: negative Pronator  drift: absent     Right Left Spurlings positive negative Hoffman's: normal normal Clonus: normal normal Babinski: normal normal SLR: negative negative Patrick's Pearlean Brownie): negative negative Toe Walk: normal normal Toe Lift: normal normal Heel Walk: normal normal Tinels Elbow: negative negative Tinels Wrist: negative negative Phalen: negative negative   Additional Findings:  Patient has right parascapular pain to palpation.  In addition to numbness in her right first digit she also has numbness in her right second digit.   DIAGNOSTIC RESULTS Cervical radiographs demonstrate C5 C6 facet arthropathy and uncinate hypertrophy with right foraminal stenosis at the C5 C6 level.    IMPRESSION The patient has a significant right C6 radiculopathy on examination.  I believe this is entirely consistent with her previous imaging and have recommended that she undergo anterior cervical decompression and fusion at the C5 C6 level.  Because of the intervening time between her previous MRI I have recommended that a new MRI be obtained to make sure there is no significant change or new structural pathology to account for her symptomatology.  Completed Orders (this encounter) Order Details Reason Side Interpretation Result Initial Treatment Date Region  Hypertension education Continue to monitor blood pressure. If remains elevated, contact primary care physician.        Lifestyle education regarding diet Encouraged to eat a well balanced diet and follow up with primary care physician.        Cervical Spine- AP/Lat/Obls/Odontoid/Flex/Ex      08/06/2014 All Levels to All Levels   Assessment/Plan # Detail  Type Description   1. Assessment Cervicalgia (M54.2).       2. 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       3. Assessment Elevated blood-pressure reading, w/o diagnosis of htn (R03.0).       4. Assessment Body mass index (BMI) 39.0-39.9, adult (Z68.39).    Plan Orders Today's instructions / counseling include(s) Lifestyle education regarding diet.       5. Assessment Cervical radiculopathy (M54.12).         Pain Assessment/Treatment Pain Scale: 2/10. Method: Numeric Pain Intensity Scale. Location: neck/arm. Onset: 08/05/2012. Duration: varies. Quality: discomforting. Pain Assessment/Treatment follow-up plan of care: Patient is taking medications as prescribed..  Tentative plan is to proceed with anterior cervical decompression and fusion at the C5 C6 level at [facility] and to obtain a new cervical MRI prior to performing surgery.  Risks and benefits were discussed in detail with the patient and she wished to proceed with surgery.  She knows the importance of weight control.  Orders: Diagnostic Procedures: Assessment Procedure  M50.22 ACDF - C5-C6  M50.22 MRI Spinal/cerv W/o Contrast  M54.12 ACDF - C5-C6  M54.12 Cervical Spine- AP/Lat/Obls/Odontoid/Flex/Ex  M54.12 MRI Spinal/cerv W/o Contrast  Instruction(s)/Education: Assessment Instruction  R03.0 Hypertension education  Z68.39 Lifestyle education regarding diet             Provider:  Danae Orleans. Venetia Maxon MD  08/11/2014 02:28 PM Dictation edited by: Danae Orleans. Venetia Maxon    CC Providers: Maeola Harman MD [address] [place], [place] [address]              Electronically signed by Danae Orleans. Venetia Maxon MD on 08/11/2014 02:28 PM

## 2014-09-27 NOTE — Anesthesia Procedure Notes (Signed)
Procedure Name: Intubation Date/Time: 09/27/2014 12:21 PM Performed by: Suzy Bouchard Pre-anesthesia Checklist: Patient identified, Timeout performed, Emergency Drugs available, Suction available and Patient being monitored Patient Re-evaluated:Patient Re-evaluated prior to inductionOxygen Delivery Method: Circle system utilized Preoxygenation: Pre-oxygenation with 100% oxygen Intubation Type: IV induction Ventilation: Mask ventilation without difficulty Laryngoscope Size: Miller and 2 Grade View: Grade I Tube type: Oral Tube size: 7.0 mm Number of attempts: 1 Airway Equipment and Method: Stylet and LTA kit utilized Placement Confirmation: ETT inserted through vocal cords under direct vision,  breath sounds checked- equal and bilateral and positive ETCO2 Secured at: 21 cm Tube secured with: Tape Dental Injury: Teeth and Oropharynx as per pre-operative assessment

## 2014-09-27 NOTE — Brief Op Note (Signed)
09/27/2014  2:04 PM  PATIENT:  Adriana Rice  49 y.o. 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 C 56 level  POST-OPERATIVE DIAGNOSIS:  Cervical radiculopathy, Cervicalgia, Displacement of intervertebral disc of mid cervical region C 56 level  PROCEDURE:  Procedure(s) with comments: Cervical Five-Six  Anterior cervical decompression/diskectomy/fusion (N/A) - anterior with PEEK cage, autograft, plate  SURGEON:  Surgeon(s) and Role:    * Deniah Saia, MD - Primary    * Robert Nudelman, MD - Assisting  PHYSICIAN ASSISTANT:   ASSISTANTS: Poteat, RN   ANESTHESIA:   general  EBL:  Total I/O In: 1000 [I.V.:1000] Out: 25 [Blood:25]  BLOOD ADMINISTERED:none  DRAINS: none   LOCAL MEDICATIONS USED:  LIDOCAINE   SPECIMEN:  No Specimen  DISPOSITION OF SPECIMEN:  N/A  COUNTS:  YES  TOURNIQUET:  * No tourniquets in log *  DICTATION: DICTATION: Patient is 49 year old female with spur, HNP C 56 stenosis, radiculopathy.  It was elected to take her to surgery for anterior cervical decompression and fusion C 56 level.  PROCEDURE: Patient was brought to operating room and following the smooth and uncomplicated induction of general endotracheal anesthesia her head was placed on a horseshoe head holder she was placed in 5 pounds of Holter traction and her anterior neck was prepped and draped in usual sterile fashion. An incision was made on the left side of midline after infiltrating the skin and subcutaneous tissues with local lidocaine. The platysmal layer was incised and subplatysmal dissection was performed exposing the anterior border sternocleidomastoid muscle. Using blunt dissection the carotid sheath was kept lateral and trachea and esophagus kept medial exposing the anterior cervical spine. A bent spinal needle was placed it was felt to be the C 56 level and this was confirmed on intraoperative  x-ray. Longus coli muscles were taken down from the anterior cervical spine using electrocautery and key elevator and self-retaining retractor was placed exposing the C 56 level. A large ventral osteophyte was removed with Leksell and drill. The interspace was incised and a thorough discectomy was performed. Distraction pins were placed. Uncinate spurs and central spondylitic ridges were drilled down with a high-speed drill. The spinal cord dura and both C6 nerve roots were widely decompressed. Hemostasis was assured. After trial sizing a 7 mm lordotic PEEK cage was selected and packed with local autograft. This was tamped into position and countersunk appropriately. Distraction weight was removed. A 22 mm Nuvasive Archon anterior cervical plate was affixed to the cervical spine with 13 mm variable-angle screws 2 at C5, 2 at C6. All screws were well-positioned and locking mechanisms were engaged. A final X ray was obtained which showed well positioned graft and anterior plate without complicating features. Soft tissues were inspected and found to be in good repair. The wound was irrigated. The platysma layer was closed with 3-0 Vicryl stitches and the skin was reapproximated with 3-0 Vicryl subcuticular stitches. The wound was dressed with Dermabond. Counts were correct at the end of the case. Patient was extubated and taken to recovery in stable and satisfactory condition.   PLAN OF CARE: Admit to inpatient   PATIENT DISPOSITION:  PACU - hemodynamically stable.   Delay start of Pharmacological VTE agent (>24hrs) due to surgical blood loss or risk of bleeding: yes  

## 2014-09-27 NOTE — Anesthesia Preprocedure Evaluation (Addendum)
Anesthesia Evaluation  Patient identified by MRN, date of birth, ID band Patient awake    Reviewed: Allergy & Precautions, NPO status , Patient's Chart, lab work & pertinent test results  History of Anesthesia Complications (+) PONV and history of anesthetic complications  Airway Mallampati: II  TM Distance: >3 FB Neck ROM: Full    Dental no notable dental hx. (+) Dental Advisory Given   Pulmonary asthma , pneumonia -,  breath sounds clear to auscultation  Pulmonary exam normal       Cardiovascular negative cardio ROS Normal cardiovascular examRhythm:Regular Rate:Normal     Neuro/Psych Seizures -,  PSYCHIATRIC DISORDERS Anxiety Depression    GI/Hepatic negative GI ROS, Neg liver ROS,   Endo/Other  negative endocrine ROS  Renal/GU negative Renal ROS     Musculoskeletal  (+) Arthritis -,   Abdominal (+) + obese,   Peds  Hematology negative hematology ROS (+)   Anesthesia Other Findings   Reproductive/Obstetrics negative OB ROS                            Anesthesia Physical Anesthesia Plan  ASA: III  Anesthesia Plan: General   Post-op Pain Management:    Induction: Intravenous  Airway Management Planned: Oral ETT  Additional Equipment:   Intra-op Plan:   Post-operative Plan: Extubation in OR  Informed Consent: I have reviewed the patients History and Physical, chart, labs and discussed the procedure including the risks, benefits and alternatives for the proposed anesthesia with the patient or authorized representative who has indicated his/her understanding and acceptance.   Dental advisory given  Plan Discussed with: CRNA  Anesthesia Plan Comments:         Anesthesia Quick Evaluation

## 2014-09-27 NOTE — Op Note (Signed)
09/27/2014  2:04 PM  PATIENT:  Adriana Rice  49 y.o. 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 C 56 level  POST-OPERATIVE DIAGNOSIS:  Cervical radiculopathy, Cervicalgia, Displacement of intervertebral disc of mid cervical region C 56 level  PROCEDURE:  Procedure(s) with comments: Cervical Five-Six  Anterior cervical decompression/diskectomy/fusion (N/A) - anterior with Rice cage, autograft, plate  SURGEON:  Surgeon(s) and Role:    * Maeola Harman, MD - Primary    * Shirlean Kelly, MD - Assisting  PHYSICIAN ASSISTANT:   ASSISTANTS: Poteat, RN   ANESTHESIA:   general  EBL:  Total I/O In: 1000 [I.V.:1000] Out: 25 [Blood:25]  BLOOD ADMINISTERED:none  DRAINS: none   LOCAL MEDICATIONS USED:  LIDOCAINE   SPECIMEN:  No Specimen  DISPOSITION OF SPECIMEN:  N/A  COUNTS:  YES  TOURNIQUET:  * No tourniquets in log *  DICTATION: DICTATION: Patient is 49 year old female with spur, HNP C 56 stenosis, radiculopathy.  It was elected to take her to surgery for anterior cervical decompression and fusion C 56 level.  PROCEDURE: Patient was brought to operating room and following the smooth and uncomplicated induction of general endotracheal anesthesia her head was placed on a horseshoe head holder she was placed in 5 pounds of Holter traction and her anterior neck was prepped and draped in usual sterile fashion. An incision was made on the left side of midline after infiltrating the skin and subcutaneous tissues with local lidocaine. The platysmal layer was incised and subplatysmal dissection was performed exposing the anterior border sternocleidomastoid muscle. Using blunt dissection the carotid sheath was kept lateral and trachea and esophagus kept medial exposing the anterior cervical spine. A bent spinal needle was placed it was felt to be the C 56 level and this was confirmed on intraoperative  x-ray. Longus coli muscles were taken down from the anterior cervical spine using electrocautery and key elevator and self-retaining retractor was placed exposing the C 56 level. A large ventral osteophyte was removed with Leksell and drill. The interspace was incised and a thorough discectomy was performed. Distraction pins were placed. Uncinate spurs and central spondylitic ridges were drilled down with a high-speed drill. The spinal cord dura and both C6 nerve roots were widely decompressed. Hemostasis was assured. After trial sizing a 7 mm lordotic Rice cage was selected and packed with local autograft. This was tamped into position and countersunk appropriately. Distraction weight was removed. A 22 mm Nuvasive Archon anterior cervical plate was affixed to the cervical spine with 13 mm variable-angle screws 2 at C5, 2 at C6. All screws were well-positioned and locking mechanisms were engaged. A final X ray was obtained which showed well positioned graft and anterior plate without complicating features. Soft tissues were inspected and found to be in good repair. The wound was irrigated. The platysma layer was closed with 3-0 Vicryl stitches and the skin was reapproximated with 3-0 Vicryl subcuticular stitches. The wound was dressed with Dermabond. Counts were correct at the end of the case. Patient was extubated and taken to recovery in stable and satisfactory condition.   PLAN OF CARE: Admit to inpatient   PATIENT DISPOSITION:  PACU - hemodynamically stable.   Delay start of Pharmacological VTE agent (>24hrs) due to surgical blood loss or risk of bleeding: yes

## 2014-09-27 NOTE — Interval H&P Note (Signed)
History and Physical Interval Note:  09/27/2014 7:12 AM  Adriana Rice  has presented today for surgery, with the diagnosis of Cervical radiculopathy, Cervicalgia, Displacement of intervertebral disc of mid cervical region  The various methods of treatment have been discussed with the patient and family. After consideration of risks, benefits and other options for treatment, the patient has consented to  Procedure(s) with comments: C5-6 Anterior cervical decompression/diskectomy/fusion (N/A) - C5-6 Anterior cervical decompression/diskectomy/fusion as a surgical intervention .  The patient's history has been reviewed, patient examined, no change in status, stable for surgery.  I have reviewed the patient's chart and labs.  Questions were answered to the patient's satisfaction.     [name]

## 2014-09-28 ENCOUNTER — Encounter (HOSPITAL_COMMUNITY): Payer: Self-pay | Admitting: Neurosurgery

## 2014-09-28 NOTE — Progress Notes (Signed)
OT Cancellation Note  Patient Details Name: Adriana Rice MRN: [id number] DOB: 1965-08-24   Cancelled Treatment:    Reason Eval/Treat Not Completed: OT screened, no needs identified, will sign off  Evern Bio 09/28/2014, 8:54 AM

## 2014-09-28 NOTE — Progress Notes (Signed)
Subjective: Patient reports "I don't have the pain in my arm, just numbness now"  Objective: Vital signs in last 24 hours: Temp:  [98 F (36.7 C)-98.6 F (37 C)] 98.2 F (36.8 C) (07/29 0442) Pulse Rate:  [67-95] 67 (07/29 0442) Resp:  [12-21] 17 (07/29 0442) BP: (108-154)/(53-87) 122/62 mmHg (07/29 0442) SpO2:  [94 %-99 %] 96 % (07/29 0442) Weight:  [108.863 kg (240 lb)] 108.863 kg (240 lb) (07/28 0800)  Intake/Output from previous day: 07/28 0701 - 07/29 0700 In: 2280 [P.O.:360; I.V.:1920] Out: 25 [Blood:25] Intake/Output this shift:    Alert, conversant. Reports posterior cervical & bilateral trapezius pain as expected, with RUE numbness. Strength is good BUE. Incision is flat, without erythema or drainage. Dermabond intact. Soft collar in use.   Lab Results: No results for input(s): WBC, HGB, HCT, PLT in the last 72 hours. BMET No results for input(s): NA, K, CL, CO2, GLUCOSE, BUN, CREATININE, CALCIUM in the last 72 hours.  Studies/Results: Dg Cervical Spine 2-3 Views  09/27/2014   CLINICAL DATA:  Cervical disc herniation without myelopathy.  EXAM: CERVICAL SPINE - 2-3 VIEW  COMPARISON:  None.  FINDINGS: First cross-table lateral radiograph shows a needle anteriorly overlying the intervertebral disc space at C5-6.  Second crosstable lateral radiograph shows anterior fixation plate and screws at level of C5-6 with normal alignment.  IMPRESSION: Anterior cervical discectomy and fusion at C5-6 with normal alignment.   Electronically Signed   By: Myles Rosenthal M.D.   On: 09/27/2014 14:29    Assessment/Plan: Improved   LOS: 1 day  Per DrStern, d/c IV, d/c to home. Pt verbalizes understanding of d/c instructions and agrees to call office to schedule 3-4 wk f/u. Rx's Percocet 5/325 1-2 po q4-6hrs prn pain #60 & Robaxin  1 po q8hrs prn spasm #60 to chart.   Georgiann Cocker 09/28/2014, 7:45 AM

## 2014-09-28 NOTE — Progress Notes (Signed)
Patient alert and oriented, mae's well, voiding adequate amount of urine, swallowing without difficulty, no c/o pain. Patient discharged home with family. Script and discharged instructions given to patient. Patient and family stated understanding of d/c instructions given and has an appointment with MD. 

## 2014-09-28 NOTE — Evaluation (Signed)
Physical Therapy Evaluation Patient Details Name: Adriana Rice MRN: [id number] DOB: Jun 02, 1965 Today's Date: 09/28/2014   History of Present Illness  Pt. admitted 09/27/14, underwent ACDF C5-6 for cervical radiculopathy, cervicalgia, and displacement of intervertebral disc C5-6  Clinical Impression  Pt. Presents s/p above surgery and is mobilizing at an independent level of  Transfers and gait, supervision level on steps for safety.  I have completed necessary education and provided her a cervical precautions handout.  She has no further acute PT needs.  I will sign off.      Follow Up Recommendations No PT follow up    Equipment Recommendations  None recommended by PT    Recommendations for Other Services       Precautions / Restrictions Precautions Precautions: Cervical Precaution Comments: I provided pt with cervical precautions handout and reviewed.  Pt. voiced understanding Required Braces or Orthoses: Cervical Brace Cervical Brace: Soft collar (Pt. states she was told at all times except showering by MD) Restrictions Weight Bearing Restrictions: No      Mobility  Bed Mobility Overal bed mobility:  (not tested, up in chair, reviewed log rolling)                Transfers Overall transfer level: Independent Equipment used: None             General transfer comment: Pt. able to move sit<>stand at an independent level and with good technique; no issues noted  Ambulation/Gait Ambulation/Gait assistance: Independent Ambulation Distance (Feet): 150 Feet Assistive device: None Gait Pattern/deviations: WFL(Within Functional Limits) Gait velocity: normal   General Gait Details: Pt. able to ambulate without overt LOB, good technqiue  Stairs Stairs: Yes Stairs assistance: Supervision Stair Management: Two rails;Forwards Number of Stairs: 9 General stair comments: Pt. able to negotiate steps safely with no overt LOB and use of 2 rails.  Wheelchair  Mobility    Modified Rankin (Stroke Patients Only)       Balance Overall balance assessment: No apparent balance deficits (not formally assessed)                                           Pertinent Vitals/Pain Pain Assessment: 0-10 Pain Score: 3  Pain Location: posterior cervical Pain Descriptors / Indicators: Sore Pain Intervention(s): Limited activity within patient's tolerance;Monitored during session;Premedicated before session    Home Living Family/patient expects to be discharged to:: Private residence Living Arrangements: Spouse/significant other Available Help at Discharge: Family;Available 24 hours/day Type of Home: House Home Access: Stairs to enter Entrance Stairs-Rails: Doctor, general practice of Steps: flight Home Layout: One level Home Equipment: None      Prior Function Level of Independence: Independent               Hand Dominance        Extremity/Trunk Assessment   Upper Extremity Assessment: Overall WFL for tasks assessed (pt. has "numbness" down R arm but no more pain)           Lower Extremity Assessment: Overall WFL for tasks assessed      Cervical / Trunk Assessment: Other exceptions (in soft cervical collar)  Communication   Communication: No difficulties  Cognition Arousal/Alertness: Awake/alert Behavior During Therapy: WFL for tasks assessed/performed Overall Cognitive Status: Within Functional Limits for tasks assessed  General Comments      Exercises        Assessment/Plan    PT Assessment Patent does not need any further PT services  PT Diagnosis Acute pain   PT Problem List    PT Treatment Interventions     PT Goals (Current goals can be found in the Care Plan section)      Frequency     Barriers to discharge        Co-evaluation               End of Session Equipment Utilized During Treatment: Cervical collar Activity Tolerance:  Patient tolerated treatment well Patient left: in chair;with call bell/phone within reach;with family/visitor present (husband present) Nurse Communication: Mobility status;Patient requests pain meds;Other (comment) (pt. requesting muscle relaxer)         Time: 2585-2778 PT Time Calculation (min) (ACUTE ONLY): 19 min   Charges:   PT Evaluation $Initial PT Evaluation Tier I: 1 Procedure     PT G CodesFerman Hamming 09/28/2014, 11:32 AM Weldon Picking PT [facility] [phone] Beeper [phone]

## 2014-09-28 NOTE — Anesthesia Postprocedure Evaluation (Signed)
Anesthesia Post Note  Patient: Adriana Rice  Procedure(s) Performed: Procedure(s) (LRB): Cervical Five-Six  Anterior cervical decompression/diskectomy/fusion (N/A)  Anesthesia type: General  Patient location: PACU  Post pain: Pain level controlled  Post assessment: Post-op Vital signs reviewed  Last Vitals: BP 119/72 mmHg  Pulse 60  Temp(Src) 36.9 C (Oral)  Resp 18  Ht  (1.727 m)  Wt 240 lb (108.863 kg)  BMI 36.50 kg/m2  SpO2 99%  Post vital signs: Reviewed  Level of consciousness: sedated  Complications: No apparent anesthesia complications

## 2014-09-28 NOTE — Discharge Summary (Signed)
Physician Discharge Summary  Patient ID: Adriana Rice MRN: [id number] DOB/AGE: 1965-06-26 49 y.o.  Admit date: 09/27/2014 Discharge date: 09/28/2014  Admission Diagnoses: Cervical radiculopathy, Cervicalgia, Displacement of intervertebral disc of mid cervical region C 56 level   Discharge Diagnoses: Cervical radiculopathy, Cervicalgia, Displacement of intervertebral disc of mid cervical region C 56 level s/p Cervical Five-Six  Anterior cervical decompression/diskectomy/fusion (N/A) - anterior with PEEK cage, autograft, plate  Active Problems:   Herniated cervical disc without myelopathy   Discharged Condition: good  Hospital Course: Adriana Rice was admitted for surgery with dx HNP and radiculopathy. Following uncomplicated ACDF C5-6, she recovered nicely and transferred to [facility] for observation.  Consults: None  Significant Diagnostic Studies: radiology: X-Ray: intra-operative  Treatments: surgery: Cervical Five-Six  Anterior cervical decompression/diskectomy/fusion (N/A) - anterior with PEEK cage, autograft, plate   Discharge Exam: Blood pressure 122/62, pulse 67, temperature 98.2 F (36.8 C), temperature source Oral, resp. rate 17, height  (1.727 m), weight 108.863 kg (240 lb), SpO2 96 %. Alert, conversant. Reports posterior cervical & bilateral trapezius pain as expected, with RUE numbness. No dysphagia. Strength is good BUE. Incision is flat, without erythema or drainage. Dermabond intact. Soft collar in use.    Disposition:Discharge to home. Pt verbalizes understanding of d/c instructions and agrees to call office to schedule 3-4 wk f/u. Rx's Percocet 5/325 1-2 po q4-6hrs prn pain #60 & Robaxin  1 po q8hrs prn spasm #60 to chart.      Medication List    ASK your doctor about these medications        albuterol 108 (90 BASE) MCG/ACT inhaler  Commonly known as:  PROAIR HFA  USE 2 INHALATIONS INTO THE LUNGS EVERY 6 HOURS AS NEEDED FOR WHEEZING OR SHORTNESS  OF BREATH     ALPRAZolam 0.5 MG tablet  Commonly known as:  XANAX  QHS or up to BID prn     cholecalciferol 1000 UNITS tablet  Commonly known as:  VITAMIN D  Take 1,000 Units by mouth daily.     magnesium 30 MG tablet  Take 30 mg by mouth daily.     Melatonin 5 MG Caps  Take 5 mg by mouth at bedtime as needed (sleep).     naproxen sodium 220 MG tablet  Commonly known as:  ANAPROX  Take 220 mg by mouth as needed (pain).     pregabalin 75 MG capsule  Commonly known as:  LYRICA  Take 1 capsule (75 mg total) by mouth 2 (two) times daily.     Vortioxetine HBr 20 MG Tabs  Commonly known as:  BRINTELLIX  Take 20 mg by mouth daily.           Follow-up Information    Follow up with Dorian Heckle, MD.   Specialty:  Neurosurgery   Contact information:   [address] [place] [address] [phone]       Signed: Georgiann Cocker 09/28/2014, 7:50 AM

## 2014-10-24 ENCOUNTER — Other Ambulatory Visit: Payer: Self-pay | Admitting: Medical

## 2014-10-24 NOTE — Telephone Encounter (Signed)
Is this okay to refill? 

## 2014-10-24 NOTE — Telephone Encounter (Signed)
pls refill 

## 2014-12-19 ENCOUNTER — Telehealth: Payer: Self-pay

## 2014-12-19 ENCOUNTER — Other Ambulatory Visit: Payer: Self-pay | Admitting: Medical

## 2014-12-19 MED ORDER — PREGABALIN 75 MG PO CAPS: 75.0000 mg | ORAL_CAPSULE | Freq: Two times a day (BID) | ORAL | Status: DC

## 2014-12-19 NOTE — Telephone Encounter (Signed)
Refill request for Lyrica 75mg  #180 [facility]. She wasn't hoping to need this again after her surgery, but says she does.

## 2014-12-19 NOTE — Telephone Encounter (Signed)
i printed the script.  Please fax to her mail order pharmacy

## 2014-12-19 NOTE — Telephone Encounter (Signed)
done

## 2015-01-21 ENCOUNTER — Other Ambulatory Visit: Payer: Self-pay | Admitting: Medical

## 2015-01-21 NOTE — Telephone Encounter (Signed)
Please ask her make f/u appointment on this medication at this time as well, prior to additional refills.

## 2015-01-21 NOTE — Telephone Encounter (Signed)
Is this ok to refill?  

## 2015-01-22 ENCOUNTER — Telehealth: Payer: Self-pay

## 2015-01-22 NOTE — Telephone Encounter (Signed)
Refill request for Alprazolam 0.5mg  #100. [facility]

## 2015-01-22 NOTE — Telephone Encounter (Signed)
Scheduled for 01/30/15

## 2015-01-22 NOTE — Telephone Encounter (Signed)
Lets see her in f/u on these medications.  Please schedule OV

## 2015-01-30 ENCOUNTER — Encounter: Payer: Self-pay | Admitting: Medical

## 2015-01-30 ENCOUNTER — Ambulatory Visit (INDEPENDENT_AMBULATORY_CARE_PROVIDER_SITE_OTHER): Payer: BLUE CROSS/BLUE SHIELD | Admitting: Medical

## 2015-01-30 VITALS — BP 120/74 | HR 91 | Wt 237.0 lb

## 2015-01-30 DIAGNOSIS — G4700 Insomnia, unspecified: Secondary | ICD-10-CM

## 2015-01-30 DIAGNOSIS — F411 Generalized anxiety disorder: Secondary | ICD-10-CM | POA: Diagnosis not present

## 2015-01-30 DIAGNOSIS — M5020 Other cervical disc displacement, unspecified cervical region: Secondary | ICD-10-CM | POA: Diagnosis not present

## 2015-01-30 DIAGNOSIS — F329 Major depressive disorder, single episode, unspecified: Secondary | ICD-10-CM

## 2015-01-30 DIAGNOSIS — F32A Depression, unspecified: Secondary | ICD-10-CM

## 2015-01-30 MED ORDER — ALPRAZOLAM 0.5 MG PO TABS: 0.5000 mg | ORAL_TABLET | Freq: Every evening | ORAL | Status: DC | PRN

## 2015-01-30 NOTE — Progress Notes (Signed)
Subjective: Chief Complaint  Patient presents with  . med check    said that she should have been able to go off of it but she said that she is still having nerve pain. said it is from her elbow to her hands. 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 pt states that she is losing her job in 2 months and with her being the sole supporter on her family. she said she has tried other sleep aides and xanax is all that works for her.    Here for f/u.     Had neck surgery in July.   Lately since surgery, still has pains.  By mid week has lots of pain working on the computer every day.   Had been on Lyrica, ended up switching to Gabapentin.  But gabapentin takes away the pains so is now back on lyrica per neurosurgery.  Having to use Ultram on a regular basis due to pains in lower arms.   Can take Aleve or Ibpuorfen or NSAIDs for a year due to cadaver bone   Here mainly for recheck on Xanax.   Takes it nightly for sleep and it works.   Without taking this her mind won't shut off.   She is under financial stress.   She will be losing her job in January.   Has 2 kids in high school, 1 in college, and husband currently not working due to limitations not being able to drive at night due eye issues and other health problems. He plans to try and find work after first of the year.  Has failed Unisom, Tylenol pm.    She has cut out all processed sugar since a year ago and is down 20 lb intentionally since last visit.  Past Medical History  Diagnosis Date  . Obesity   . PMDD (premenstrual dysphoric disorder)   . Cervical disc disease   . Anxiety   . Asthma   . Edema   . Pneumonia 2005  . Allergy   . Wears glasses   . Seizures (HCC) 2011    during surgery for rotator cuff  . Complication of anesthesia     waking up   . PONV (postoperative nausea and vomiting)     during her rotator cuff surgery and even with C-sections. was told she had a seizure during surgery.  .  Arthritis    ROS as in subjective   Objective: BP 120/74 mmHg  Pulse 91  Wt 237 lb (107.502 kg)  LMP 01/09/2015  Gen: wdwn,nad Psych: pleasant, good eye contact, answers questions appropriately   Wt Readings from Last 3 Encounters:  01/30/15 237 lb (107.502 kg)  09/27/14 240 lb (108.863 kg)  07/26/14 258 lb (117.028 kg)   Assessment: Encounter Diagnoses  Name Primary?  . Depression Yes  . Insomnia   . Herniated cervical disc without myelopathy   . Generalized anxiety disorder     Plan: Depression - stable, doing fine on Trintellix Insomnia - discus sleep hygiene.  Does fine on xanax QHS, but consider coming off this after early 2017 when she hopefully gets a job.  Insomnia mostly due to stress and worry.  Discussed ways to cope with anxiety Glad to see she has lost weight! Neck pains, radiculopathy - followed by neurosurgery

## 2015-03-13 ENCOUNTER — Other Ambulatory Visit: Payer: Self-pay | Admitting: Medical

## 2015-03-13 ENCOUNTER — Telehealth: Payer: Self-pay | Admitting: Medical

## 2015-03-13 MED ORDER — PREGABALIN 75 MG PO CAPS: 75.0000 mg | ORAL_CAPSULE | Freq: Two times a day (BID) | ORAL | Status: AC

## 2015-03-13 MED ORDER — ALPRAZOLAM 0.5 MG PO TABS: 0.5000 mg | ORAL_TABLET | Freq: Every evening | ORAL | Status: DC | PRN

## 2015-03-13 NOTE — Telephone Encounter (Signed)
I printed scripts.  Please pick them up for me to sign then fax for the refill as they can't be sent electronically

## 2015-03-13 NOTE — Telephone Encounter (Signed)
Recv'd faxed refill request from [facility] for Alprazolam 0.5mg  and Lyrinca 75mg  both for 90 days

## 2015-03-14 NOTE — Telephone Encounter (Signed)
Adriana Rice faxed Rx's in

## 2015-04-19 ENCOUNTER — Other Ambulatory Visit: Payer: Self-pay | Admitting: Medical

## 2015-04-19 NOTE — Telephone Encounter (Signed)
Is this ok to refill?  

## 2016-12-17 LAB — HM MAMMOGRAPHY

## 2016-12-30 ENCOUNTER — Telehealth: Payer: Self-pay | Admitting: Medical

## 2016-12-30 NOTE — Telephone Encounter (Signed)
Get in for physical 

## 2017-01-01 NOTE — Telephone Encounter (Signed)
Pt has a cpe for November the 20th

## 2017-01-19 ENCOUNTER — Encounter: Payer: BLUE CROSS/BLUE SHIELD | Admitting: Medical

## 2018-12-13 ENCOUNTER — Ambulatory Visit: Payer: BLUE CROSS/BLUE SHIELD | Admitting: Family Medicine

## 2018-12-29 ENCOUNTER — Other Ambulatory Visit: Payer: Self-pay

## 2018-12-29 ENCOUNTER — Encounter (INDEPENDENT_AMBULATORY_CARE_PROVIDER_SITE_OTHER): Payer: Managed Care, Other (non HMO) | Admitting: Family Medicine

## 2018-12-29 ENCOUNTER — Encounter: Payer: Self-pay | Admitting: Family Medicine

## 2019-02-01 ENCOUNTER — Ambulatory Visit: Payer: Managed Care, Other (non HMO) | Admitting: Family Medicine

## 2019-05-06 ENCOUNTER — Ambulatory Visit: Payer: 59 | Attending: Internal Medicine

## 2019-05-06 DIAGNOSIS — Z23 Encounter for immunization: Secondary | ICD-10-CM

## 2019-05-06 NOTE — Progress Notes (Signed)
   Covid-19 Vaccination Clinic  Name:  HOLIDAY MCMENAMIN    MRN: [id number] DOB: [birth year]  05/06/2019  Ms. Fukuda was observed post Covid-19 immunization for 15 minutes without incident. She was provided with Vaccine Information Sheet and instruction to access the V-Safe system.   Ms. Deblasi was instructed to call 911 with any severe reactions post vaccine: Marland Kitchen Difficulty breathing  . Swelling of face and throat  . A fast heartbeat  . A bad rash all over body  . Dizziness and weakness   Immunizations Administered    Name Date Dose VIS Date Route   Moderna COVID-19 Vaccine 05/06/2019  3:00 PM 0.5 mL 01/31/2019 Intramuscular   Manufacturer: Moderna   Lot: [id number]   NDC: 14970-263-78

## 2019-06-03 ENCOUNTER — Ambulatory Visit: Payer: 59 | Attending: Internal Medicine

## 2019-06-03 ENCOUNTER — Ambulatory Visit: Payer: 59

## 2019-06-03 DIAGNOSIS — Z23 Encounter for immunization: Secondary | ICD-10-CM

## 2019-06-03 NOTE — Progress Notes (Signed)
   Covid-19 Vaccination Clinic  Name:  RIANN OMAN    MRN: [id number] DOB: 1965-12-06  06/03/2019  Ms. Dayley was observed post Covid-19 immunization for 15 minutes without incident. She was provided with Vaccine Information Sheet and instruction to access the V-Safe system.   Ms. Emory was instructed to call 911 with any severe reactions post vaccine: Marland Kitchen Difficulty breathing  . Swelling of face and throat  . A fast heartbeat  . A bad rash all over body  . Dizziness and weakness   Immunizations Administered    Name Date Dose VIS Date Route   Moderna COVID-19 Vaccine 06/03/2019 11:45 AM 0.5 mL 01/31/2019 Intramuscular   Manufacturer: Gala Murdoch   Lot: [id number]   NDC: 33832-919-16

## 2019-08-16 ENCOUNTER — Other Ambulatory Visit: Payer: Self-pay

## 2019-08-16 ENCOUNTER — Encounter: Payer: Self-pay | Admitting: Family Medicine

## 2019-08-16 ENCOUNTER — Ambulatory Visit (INDEPENDENT_AMBULATORY_CARE_PROVIDER_SITE_OTHER): Payer: 59 | Admitting: Family Medicine

## 2019-08-16 VITALS — BP 138/82 | HR 88 | Temp 98.1°F | Ht 68.0 in | Wt 286.0 lb

## 2019-08-16 DIAGNOSIS — E559 Vitamin D deficiency, unspecified: Secondary | ICD-10-CM

## 2019-08-16 DIAGNOSIS — F419 Anxiety disorder, unspecified: Secondary | ICD-10-CM | POA: Diagnosis not present

## 2019-08-16 DIAGNOSIS — E119 Type 2 diabetes mellitus without complications: Secondary | ICD-10-CM

## 2019-08-16 DIAGNOSIS — Z7689 Persons encountering health services in other specified circumstances: Secondary | ICD-10-CM | POA: Diagnosis not present

## 2019-08-16 DIAGNOSIS — Z6841 Body Mass Index (BMI) 40.0 and over, adult: Secondary | ICD-10-CM | POA: Diagnosis not present

## 2019-08-16 DIAGNOSIS — R739 Hyperglycemia, unspecified: Secondary | ICD-10-CM | POA: Diagnosis not present

## 2019-08-16 LAB — COMPREHENSIVE METABOLIC PANEL
ALT: 17 U/L (ref 0–35)
AST: 12 U/L (ref 0–37)
Albumin: 3.9 g/dL (ref 3.5–5.2)
Alkaline Phosphatase: 80 U/L (ref 39–117)
BUN: 11 mg/dL (ref 6–23)
CO2: 24 mEq/L (ref 19–32)
Calcium: 8.8 mg/dL (ref 8.4–10.5)
Chloride: 100 mEq/L (ref 96–112)
Creatinine, Ser: 0.66 mg/dL (ref 0.40–1.20)
GFR: 93.31 mL/min (ref >60.00)
Glucose, Bld: 121 mg/dL — ABNORMAL HIGH (ref 70–99)
Potassium: 4.2 mEq/L (ref 3.5–5.1)
Sodium: 134 mEq/L — ABNORMAL LOW (ref 135–145)
Total Bilirubin: 0.5 mg/dL (ref 0.2–1.2)
Total Protein: 6.6 g/dL (ref 6.0–8.3)

## 2019-08-16 LAB — CBC WITH DIFFERENTIAL/PLATELET
Basophils Absolute: 0 10*3/uL (ref 0.0–0.1)
Basophils Relative: 0.4 % (ref 0.0–3.0)
Eosinophils Absolute: 0.3 10*3/uL (ref 0.0–0.7)
Eosinophils Relative: 2.4 % (ref 0.0–5.0)
HCT: 39.1 % (ref 36.0–46.0)
Hemoglobin: 13.2 g/dL (ref 12.0–15.0)
Lymphocytes Relative: 25.1 % (ref 12.0–46.0)
Lymphs Abs: 2.8 10*3/uL (ref 0.7–4.0)
MCHC: 33.8 g/dL (ref 30.0–36.0)
MCV: 86.5 fl (ref 78.0–100.0)
Monocytes Absolute: 0.7 10*3/uL (ref 0.1–1.0)
Monocytes Relative: 6.2 % (ref 3.0–12.0)
Neutro Abs: 7.3 10*3/uL (ref 1.4–7.7)
Neutrophils Relative %: 65.9 % (ref 43.0–77.0)
Platelets: 330 10*3/uL (ref 150.0–400.0)
RBC: 4.52 Mil/uL (ref 3.87–5.11)
RDW: 12.8 % (ref 11.5–15.5)
WBC: 11.1 10*3/uL — ABNORMAL HIGH (ref 4.0–10.5)

## 2019-08-16 LAB — LIPID PANEL
Cholesterol: 165 mg/dL (ref 0–200)
HDL: 51.4 mg/dL (ref >39.00)
LDL Cholesterol: 88 mg/dL (ref 0–99)
NonHDL: 113.53
Total CHOL/HDL Ratio: 3
Triglycerides: 127 mg/dL (ref 0.0–149.0)
VLDL: 25.4 mg/dL (ref 0.0–40.0)

## 2019-08-16 LAB — TSH: TSH: 2.34 u[IU]/mL (ref 0.35–4.50)

## 2019-08-16 LAB — VITAMIN B12: Vitamin B-12: 269 pg/mL (ref 211–911)

## 2019-08-16 LAB — VITAMIN D 25 HYDROXY (VIT D DEFICIENCY, FRACTURES): VITD: 16.59 ng/mL — ABNORMAL LOW (ref 30.00–100.00)

## 2019-08-16 MED ORDER — ALPRAZOLAM 0.5 MG PO TABS: 0.5000 mg | ORAL_TABLET | Freq: Every evening | ORAL | 0 refills | Status: DC | PRN

## 2019-08-16 MED ORDER — SERTRALINE HCL 50 MG PO TABS: 50.0000 mg | ORAL_TABLET | Freq: Every day | ORAL | 3 refills | Status: DC

## 2019-08-16 MED ORDER — ALPRAZOLAM 0.5 MG PO TABS: 0.5000 mg | ORAL_TABLET | Freq: Every evening | ORAL | 0 refills | Status: AC | PRN

## 2019-08-16 NOTE — Progress Notes (Signed)
Subjective:    Patient ID: Adriana Rice, female    DOB: 02/26/1966, 54 y.o.   MRN: [id number]  HPI Chief Complaint  Patient presents with  . New Patient (Initial Visit)    Pt has been monitoring BS -has noticed with increased stress her BS has been elevated.   This is a 54 yo female who presents today to establish care. She is married and has 3 children.  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 Will be going back into the office in [place] 2022.  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  Middle son with addiction problems. Feels like she has been in fight or flight for 3 years. Goes to [place]. Oldest child married. Youngest child at home, doing online college.  Will be returning on campus to [facility] in August.   Last CPE- several year  Mammo- over due Pap-11/20/2008 Colonoscopy- address at next visit Tdap- 11/20/2008 Covid-has had 2 shot series Flu-not annually Eye-regular Dental-regular Exercise- walks, 5-7 k steps daily Sleep- only sleeps 4-5 hours a night due to anxiety  Blood sugar- has a monitor at home. Increased anxiety seems to increase her readings to fasting 150. Avoids sugar. Eating 3 meals a day, 1-2 snacks. Drinks water, diet coke, coffee with half and half.   Anxiety- previously on trintellix (improved PMS, didn't like putting medication in her body), lexapro (felt awful, dizzy). Has a prescription of alprazolam that is 10 years.  Rarely uses.  Poor sleep.  Feels like she has been in a "fight or flight mode" for many years now.  Knows that she works too much and that sleep is poor.  Does not think talk therapy would be helpful for her.    Review of Systems Denies chest pain, shortness of breath, abdominal pain, diarrhea/constipation, dysuria, hematuria, muscle/joint pain, leg swelling    Objective:   Physical Exam Physical Exam  Constitutional: Oriented to person, place, and time. She appears well-developed and well-nourished.   HENT:  Head: Normocephalic and atraumatic.  Eyes: Conjunctivae are normal.  Neck: Normal range of motion. Neck supple.  Cardiovascular: Normal rate, regular rhythm and normal heart sounds.   Pulmonary/Chest: Effort normal and breath sounds normal.  Musculoskeletal: Trace LE edema.  Neurological: Alert and oriented to person, place, and time.  Skin: Skin is warm and dry.  Psychiatric: Normal mood and affect. Behavior is normal. Judgment and thought content normal.  Vitals reviewed.     BP 138/82 (BP Location: Left Arm, Patient Position: Sitting, Cuff Size: Large)   Pulse 88   Temp 98.1 F (36.7 C) (Temporal)   Ht 5\' 8"  (1.727 m)   Wt 286 lb (129.7 kg)   SpO2 97%   BMI 43.49 kg/m  Wt Readings from Last 3 Encounters:  08/16/19 286 lb (129.7 kg)  12/29/18 292 lb (132.5 kg)  01/30/15 237 lb (107.5 kg)   Depression screen PHQ 2/9 08/16/2019  Decreased Interest 2  Down, Depressed, Hopeless 2  PHQ - 2 Score 4  Altered sleeping 3  Tired, decreased energy 0  Change in appetite 0  Feeling bad or failure about yourself  3  Trouble concentrating 0  Moving slowly or fidgety/restless 0  Suicidal thoughts 0  PHQ-9 Score 10  Difficult doing work/chores Very difficult       Assessment & Plan:  1. Encounter to establish care -Reviewed available records in EMR -Discussed recommendations regarding overdue health maintenance.  Provided her number to call  to schedule screening mammogram.  We will have her follow-up in 3 months for complete physical with Pap.  We will further discuss colon cancer screening, Tdap, Shingrix vaccine  2. Anxiety -She is willing to try a different medication.  We will try low-dose sertraline to start.  Encouraged good sleep hygiene, stress management. -Follow-up in 12 weeks, sooner if any problems with medication or worsening of symptoms - sertraline (ZOLOFT) 50 MG tablet; Take 1 tablet (50 mg total) by mouth daily.  Dispense: 30 tablet; Refill: 3 - TSH -  ALPRAZolam (XANAX) 0.5 MG tablet; Take 1 tablet (0.5 mg total) by mouth at bedtime as needed for anxiety.  Dispense: 20 tablet; Refill: 0  3. Class 3 severe obesity due to excess calories without serious comorbidity with body mass index (BMI) of 40.0 to 44.9 in adult [facility]) -Discussed role of stress and sleep and attempts at weight loss.  Encouraged her to get outside daily, continue with healthy food choices. - CBC with Differential - Comprehensive metabolic panel - Hemoglobin A1c - Vitamin D, 25-hydroxy - Vitamin B12 - Lipid Panel  4. Elevated blood sugar -No previous elevations to suggest diabetes, will screen today and discussed effect of stress on blood sugar. - Comprehensive metabolic panel - Hemoglobin A1c  This visit occurred during the SARS-CoV-2 public health emergency.  Safety protocols were in place, including screening questions prior to the visit, additional usage of staff PPE, and extensive cleaning of exam room while observing appropriate contact time as indicated for disinfecting solutions.    Clarene Reamer, FNP-BC  [facility]  08/17/2019 7:57 AM

## 2019-08-16 NOTE — Patient Instructions (Addendum)
Good to see you today  Please call and schedule an appointment for screening mammogram. A referral is not needed.  [phone]  Take 1/2 tablet sertraline for 5 days then increase to 1 tablet.   Follow up for complete physical in 3 months- will discuss Tdap, shingles, colonoscopy

## 2019-08-17 ENCOUNTER — Encounter: Payer: Self-pay | Admitting: Family Medicine

## 2019-08-17 LAB — HEMOGLOBIN A1C: Hgb A1c MFr Bld: 7.6 % — ABNORMAL HIGH (ref 4.6–6.5)

## 2019-08-20 MED ORDER — METFORMIN HCL ER 500 MG PO TB24: 500.0000 mg | ORAL_TABLET | Freq: Every day | ORAL | 3 refills | Status: AC

## 2019-08-20 MED ORDER — VITAMIN D3 1.25 MG (50000 UT) PO TABS: 1.0000 | ORAL_TABLET | ORAL | 3 refills | Status: AC

## 2019-08-20 MED ORDER — ATORVASTATIN CALCIUM 20 MG PO TABS: 20.0000 mg | ORAL_TABLET | Freq: Every day | ORAL | 3 refills | Status: AC

## 2019-08-20 NOTE — Addendum Note (Signed)
Addended by: Olean Ree B on: 08/20/2019 02:15 PM   Modules accepted: Orders

## 2019-08-23 ENCOUNTER — Encounter: Payer: Self-pay | Admitting: Family Medicine

## 2019-09-07 ENCOUNTER — Other Ambulatory Visit: Payer: Self-pay | Admitting: Family Medicine

## 2019-09-07 DIAGNOSIS — F419 Anxiety disorder, unspecified: Secondary | ICD-10-CM

## 2019-09-19 ENCOUNTER — Encounter: Payer: Self-pay | Admitting: Family Medicine

## 2019-09-20 ENCOUNTER — Other Ambulatory Visit: Payer: Self-pay | Admitting: Orthopedic Surgery

## 2019-09-20 DIAGNOSIS — M25511 Pain in right shoulder: Secondary | ICD-10-CM

## 2019-10-02 ENCOUNTER — Encounter: Payer: Self-pay | Admitting: Family Medicine

## 2019-10-02 DIAGNOSIS — F419 Anxiety disorder, unspecified: Secondary | ICD-10-CM

## 2019-10-02 MED ORDER — SERTRALINE HCL 50 MG PO TABS: 100.0000 mg | ORAL_TABLET | Freq: Every day | ORAL | 3 refills | Status: DC

## 2019-10-14 ENCOUNTER — Ambulatory Visit
Admission: RE | Admit: 2019-10-14 | Discharge: 2019-10-14 | Disposition: A | Discharge status: Home / Self Care | Payer: 59 | Source: Ambulatory Visit | Attending: Orthopedic Surgery | Admitting: Orthopedic Surgery

## 2019-10-14 ENCOUNTER — Other Ambulatory Visit: Payer: Self-pay

## 2019-10-14 ENCOUNTER — Other Ambulatory Visit: Payer: 59

## 2019-10-14 DIAGNOSIS — M25511 Pain in right shoulder: Secondary | ICD-10-CM

## 2019-12-13 ENCOUNTER — Encounter: Payer: 59 | Admitting: Family Medicine

## 2020-01-01 ENCOUNTER — Other Ambulatory Visit: Payer: Self-pay | Admitting: Family Medicine

## 2020-01-01 DIAGNOSIS — F419 Anxiety disorder, unspecified: Secondary | ICD-10-CM

## 2020-01-29 ENCOUNTER — Encounter: Payer: 59 | Admitting: Family Medicine

## 2020-02-14 ENCOUNTER — Ambulatory Visit: Payer: 59 | Admitting: Family Medicine

## 2020-04-09 ENCOUNTER — Other Ambulatory Visit: Payer: Self-pay

## 2020-04-09 DIAGNOSIS — F419 Anxiety disorder, unspecified: Secondary | ICD-10-CM

## 2020-04-09 MED ORDER — SERTRALINE HCL 100 MG PO TABS: 100.0000 mg | ORAL_TABLET | Freq: Every day | ORAL | 0 refills | Status: DC

## 2020-04-09 NOTE — Telephone Encounter (Signed)
Pharmacy requests refill on: Sertraline 50 mg   LAST REFILL: 01/01/2020 (Q-30, R-0) LAST OV: 08/16/2019 NEXT OV: Not Scheduled  PHARMACY: [id number] [place], [place]

## 2020-04-09 NOTE — Telephone Encounter (Signed)
100 mg sent to pharmacy.   It appears this was increased to 100 mg back in July 2021  Please clarify that this dose is correct for patient.

## 2020-04-10 NOTE — Telephone Encounter (Signed)
Received [facility] from [facility] stating insurance requires 90 day supply.  Last visit with D. Leone Payor 08/16/2019 and was told to follow up in 3 months for CPE.  No future TOC appointments.  OK to send in 90 day supply?

## 2020-04-11 NOTE — Telephone Encounter (Signed)
Ok for 90 day supply. Please verify dose is correct. She will need to plan for acute visit if new provider not here for establish care prior to next refill.

## 2020-04-15 ENCOUNTER — Other Ambulatory Visit: Payer: Self-pay

## 2020-04-15 DIAGNOSIS — F419 Anxiety disorder, unspecified: Secondary | ICD-10-CM

## 2020-04-15 MED ORDER — SERTRALINE HCL 100 MG PO TABS: 100.0000 mg | ORAL_TABLET | Freq: Every day | ORAL | 0 refills | Status: DC

## 2020-07-09 ENCOUNTER — Other Ambulatory Visit: Payer: Self-pay | Admitting: Family Medicine

## 2020-07-09 DIAGNOSIS — F419 Anxiety disorder, unspecified: Secondary | ICD-10-CM

## 2020-07-10 NOTE — Telephone Encounter (Signed)
Pharmacy requests refill on: Sertraline 100 mg   LAST REFILL: 04/15/2020 (Q-90, R-0) LAST OV: 08/16/2019 NEXT OV: 10/09/2020 PHARMACY: [id number] [place], [place]

## 2021-01-16 ENCOUNTER — Other Ambulatory Visit: Payer: Self-pay | Admitting: Family Medicine

## 2021-01-16 DIAGNOSIS — N6320 Unspecified lump in the left breast, unspecified quadrant: Secondary | ICD-10-CM

## 2021-03-05 ENCOUNTER — Other Ambulatory Visit: Payer: 59

## 2021-04-01 NOTE — Progress Notes (Signed)
error 

## 2022-06-07 IMAGING — MR MR SHOULDER*R* W/O CM
5 series · 35 of 40 positions shown · non-contrast
Comparison: None.

CLINICAL DATA: Right shoulder pain with limited mobility.

EXAM:
MRI OF THE RIGHT SHOULDER WITHOUT CONTRAST
TECHNIQUE: Multiplanar, multisequence MR imaging of the shoulder was performed.
No intravenous contrast was administered.

[Series 3: PD fat-sat · axial · 4.0mm · 0.55mm/px · z∈[-4,+77]mm · 8 of 20 slices shown (1 of 2)]
[im 1/20]
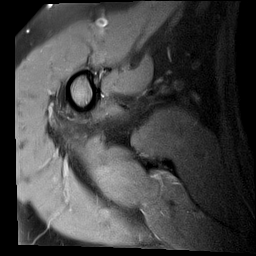
[im 3/20]
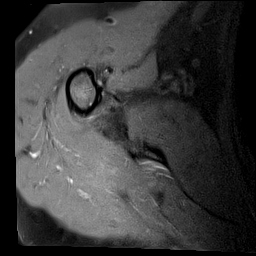
[im 6/20]
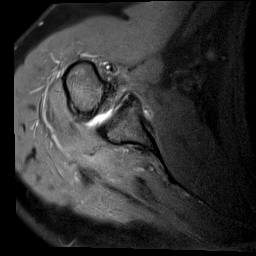
[im 9/20]
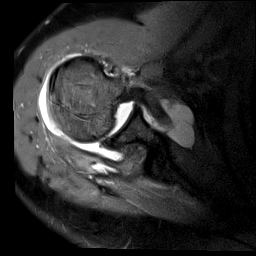
[im 11/20]
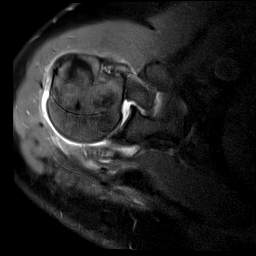
[im 14/20]
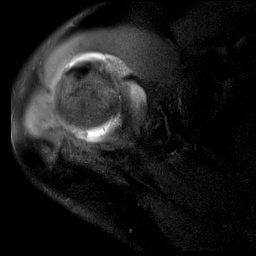
[im 17/20]
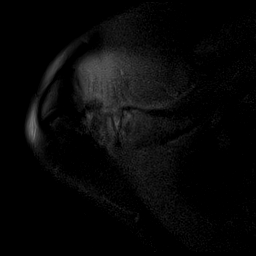
[im 20/20]
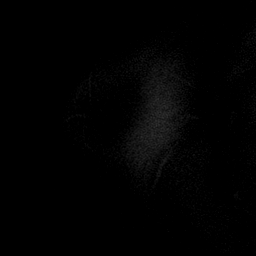

[Series 4: T1 · oblique · 4.0mm · 0.27mm/px · 4 of 22 slices shown]
[im 1/22]
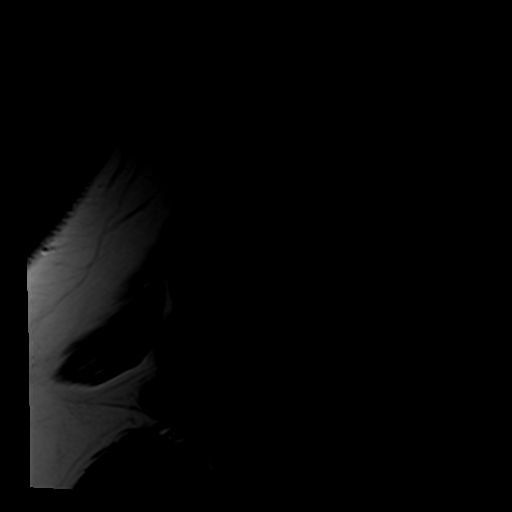
[im 3/22]
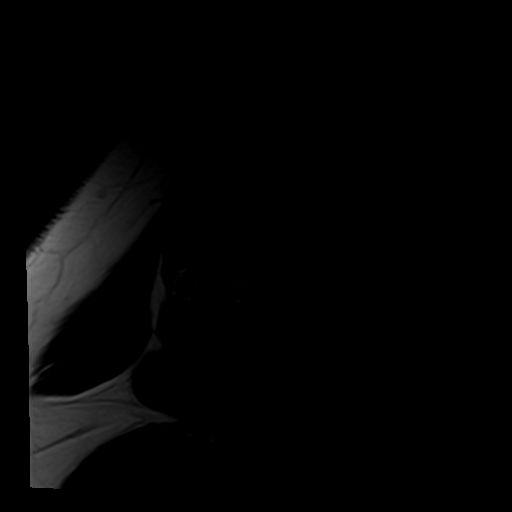
[im 6/22]
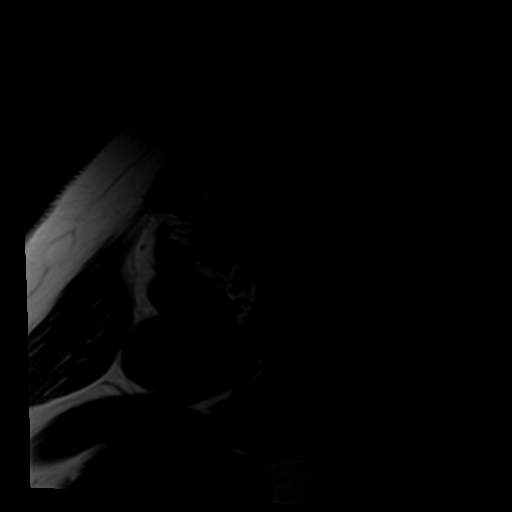
[im 8/22]
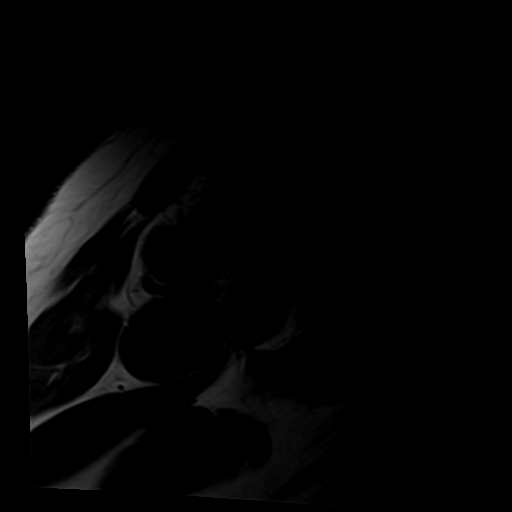

[Series 5: T2 fat-sat · oblique · 4.0mm · 0.55mm/px · 9 of 22 slices shown (1 of 2)]
[im 1/22]
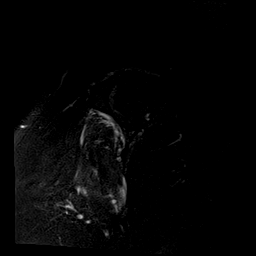
[im 3/22]
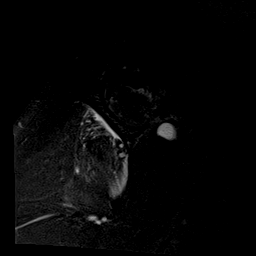
[im 6/22]
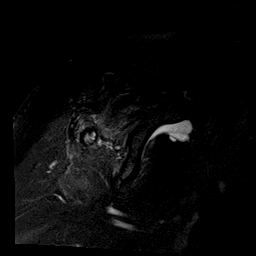
[im 8/22]
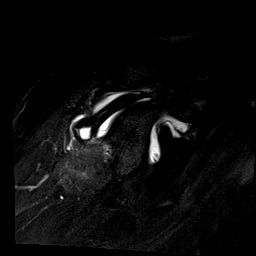
[im 11/22]
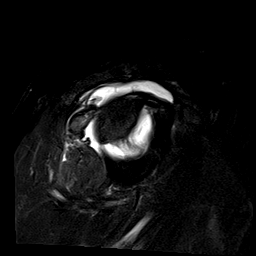
[im 14/22]
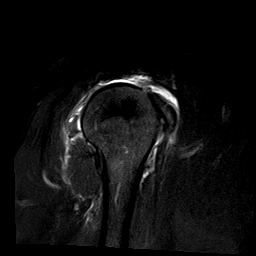
[im 16/22]
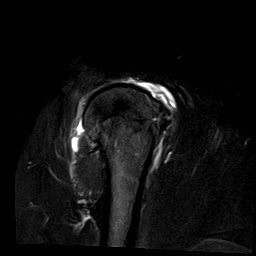
[im 19/22]
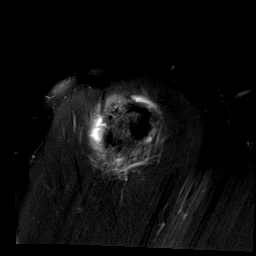
[im 22/22]
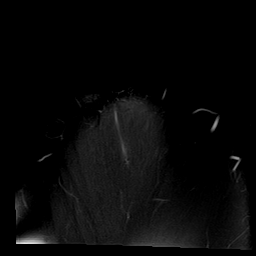

[Series 6: T2 fat-sat · oblique · 4.0mm · 0.55mm/px · 7 of 18 slices shown (2 of 2)]
[im 1/18]
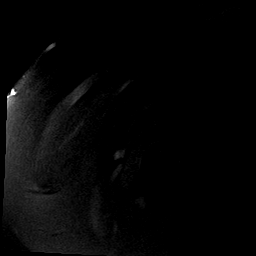
[im 3/18]
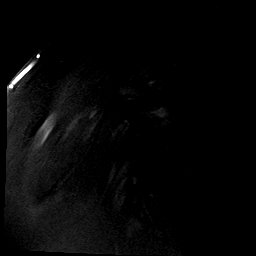
[im 6/18]
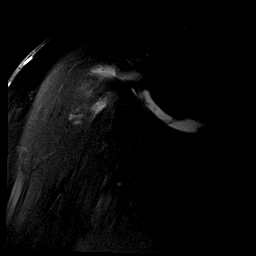
[im 9/18]
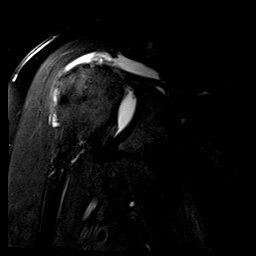
[im 12/18]
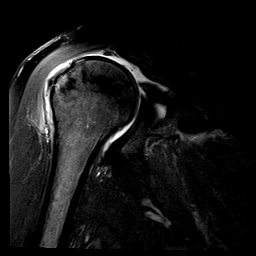
[im 15/18]
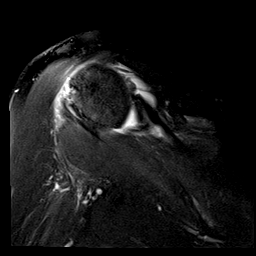
[im 18/18]
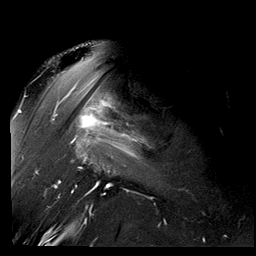

[Series 7: PD fat-sat · oblique · 4.0mm · 0.27mm/px · 7 of 18 slices shown (2 of 2)]
[im 1/18]
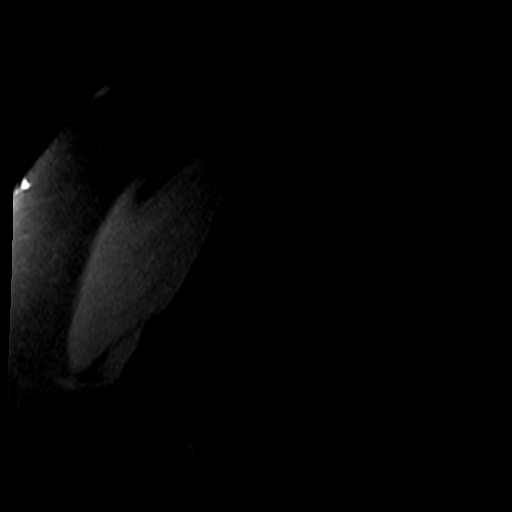
[im 3/18]
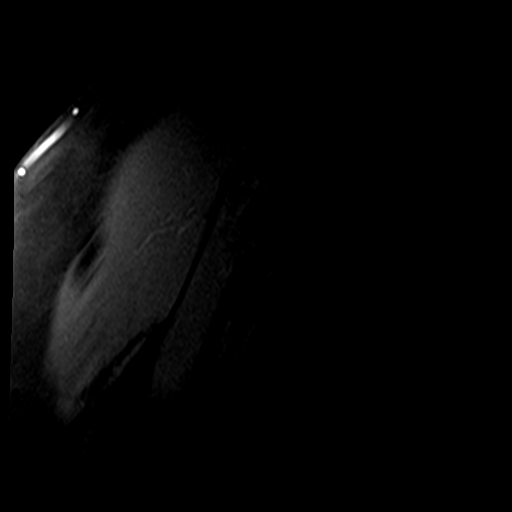
[im 6/18]
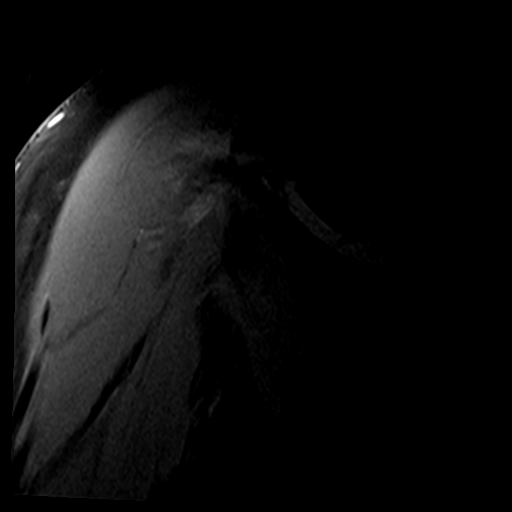
[im 9/18]
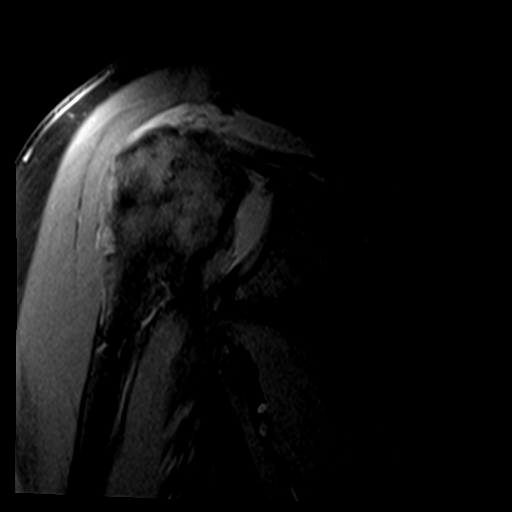
[im 12/18]
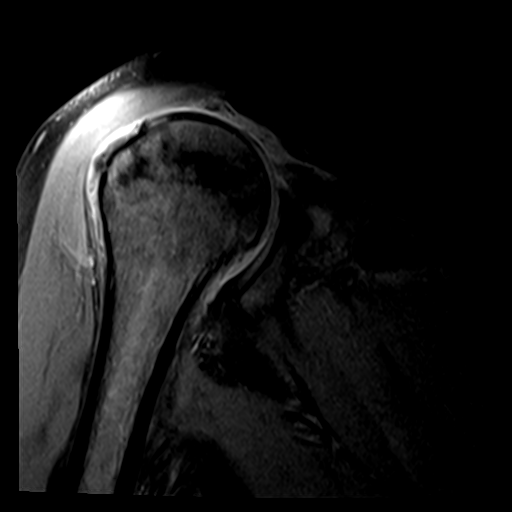
[im 15/18]
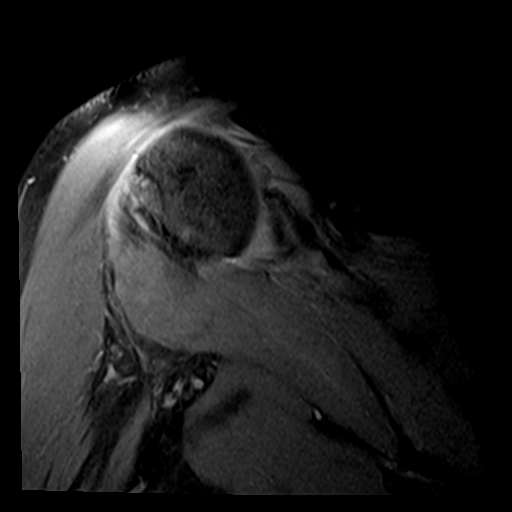
[im 18/18]
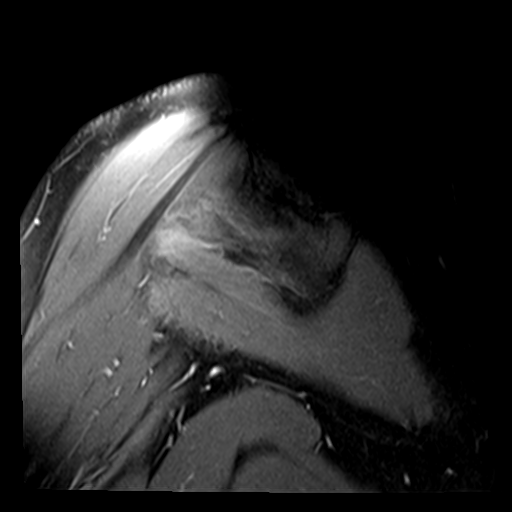

[35 of 40 positions shown; findings below may reference images not displayed]

FINDINGS: Rotator cuff: Complete tear of the supraspinatus and infraspinatus
tendons with 3.8 cm of retraction. Teres minor tendon is intact.
Severe tendinosis of the subscapularis tendon with possible
peripheral partial thickness tear.

Muscles: No muscle atrophy or edema. No intramuscular fluid
collection or hematoma.

Biceps Long Head: Severe tendinosis of the intra-articular portion
of the long head of the biceps tendon with irregularity of the
proximal extra-articular portion concerning for a partial-thickness
tear.

Acromioclavicular Joint: Moderate arthropathy of the
acromioclavicular joint. Type II acromion. Moderate amount of
subacromial/subdeltoid bursal fluid.

Glenohumeral Joint: Large joint effusion. No focal chondral defect.

Labrum: Intact.

Bones: No fracture or dislocation. No aggressive osseous lesion.

Other: No fluid collection or hematoma.
IMPRESSION: 1. Complete tear of the supraspinatus and infraspinatus tendons with
3.8 cm of retraction.
2. Severe tendinosis of the subscapularis tendon with possible
peripheral partial thickness tear.
3. Severe tendinosis of the intra-articular portion of the long head
of the biceps tendon with irregularity of the proximal
extra-articular portion concerning for a partial-thickness tear.
4. Large joint effusion.
5. Moderate subacromial/subdeltoid bursitis.
